# Patient Record
Sex: Male | Born: 1955 | Race: Black or African American | Hispanic: No | Marital: Single | Smoking: Never smoker
Health system: Southern US, Community
[De-identification: ages and names within clinical notes are randomized; demographics above are authoritative.]

## PROBLEM LIST (undated history)

## (undated) DIAGNOSIS — I639 Cerebral infarction, unspecified: Secondary | ICD-10-CM

## (undated) DIAGNOSIS — R569 Unspecified convulsions: Secondary | ICD-10-CM

## (undated) DIAGNOSIS — R609 Edema, unspecified: Secondary | ICD-10-CM

## (undated) DIAGNOSIS — I10 Essential (primary) hypertension: Secondary | ICD-10-CM

---

## 2004-07-03 ENCOUNTER — Ambulatory Visit: Payer: Self-pay | Admitting: Internal Medicine

## 2005-01-10 ENCOUNTER — Emergency Department: Payer: Self-pay | Admitting: Emergency Medicine

## 2006-01-18 ENCOUNTER — Ambulatory Visit: Payer: Self-pay | Admitting: Gastroenterology

## 2006-01-19 ENCOUNTER — Emergency Department: Payer: Self-pay | Admitting: Emergency Medicine

## 2006-07-20 ENCOUNTER — Emergency Department: Payer: Self-pay | Admitting: Emergency Medicine

## 2006-08-10 ENCOUNTER — Ambulatory Visit: Payer: Self-pay | Admitting: Gastroenterology

## 2007-01-14 ENCOUNTER — Emergency Department: Payer: Self-pay | Admitting: Emergency Medicine

## 2007-04-23 ENCOUNTER — Emergency Department: Payer: Self-pay | Admitting: Emergency Medicine

## 2008-05-11 ENCOUNTER — Emergency Department: Payer: Self-pay | Admitting: Internal Medicine

## 2008-08-22 ENCOUNTER — Emergency Department: Payer: Self-pay | Admitting: Emergency Medicine

## 2009-11-30 ENCOUNTER — Emergency Department: Payer: Self-pay | Admitting: Internal Medicine

## 2010-02-25 ENCOUNTER — Ambulatory Visit: Payer: Self-pay | Admitting: Emergency Medicine

## 2010-11-30 ENCOUNTER — Emergency Department: Payer: Self-pay | Admitting: Unknown Physician Specialty

## 2011-03-07 ENCOUNTER — Emergency Department: Payer: Self-pay | Admitting: Emergency Medicine

## 2011-11-22 ENCOUNTER — Emergency Department: Payer: Self-pay | Admitting: Unknown Physician Specialty

## 2011-11-22 LAB — CBC WITH DIFFERENTIAL/PLATELET
Basophil #: 0.1 10*3/uL (ref 0.0–0.1)
Basophil %: 1.1 %
Eosinophil #: 0 10*3/uL (ref 0.0–0.7)
HGB: 12.2 g/dL — ABNORMAL LOW (ref 13.0–18.0)
Lymphocyte %: 30.1 %
MCHC: 33.9 g/dL (ref 32.0–36.0)
MCV: 89 fL (ref 80–100)
Neutrophil #: 3.6 10*3/uL (ref 1.4–6.5)
Neutrophil %: 58.6 %
RBC: 4.03 10*6/uL — ABNORMAL LOW (ref 4.40–5.90)
RDW: 15 % — ABNORMAL HIGH (ref 11.5–14.5)
WBC: 6.2 10*3/uL (ref 3.8–10.6)

## 2011-11-22 LAB — COMPREHENSIVE METABOLIC PANEL
Anion Gap: 9 (ref 7–16)
BUN: 15 mg/dL (ref 7–18)
Bilirubin,Total: 0.2 mg/dL (ref 0.2–1.0)
Co2: 26 mmol/L (ref 21–32)
EGFR (African American): >60
Glucose: 108 mg/dL — ABNORMAL HIGH (ref 65–99)
Potassium: 3.6 mmol/L (ref 3.5–5.1)
SGPT (ALT): 21 U/L
Sodium: 135 mmol/L — ABNORMAL LOW (ref 136–145)
Total Protein: 8.4 g/dL — ABNORMAL HIGH (ref 6.4–8.2)

## 2011-11-22 LAB — SALICYLATE LEVEL: Salicylates, Serum: <1.7 mg/dL

## 2011-11-22 LAB — URIC ACID: Uric Acid: 6.5 mg/dL (ref 3.5–7.2)

## 2013-01-25 ENCOUNTER — Emergency Department: Payer: Self-pay

## 2013-01-25 LAB — COMPREHENSIVE METABOLIC PANEL
Albumin: 3.9 g/dL (ref 3.4–5.0)
Anion Gap: 4 — ABNORMAL LOW (ref 7–16)
BUN: 16 mg/dL (ref 7–18)
Co2: 31 mmol/L (ref 21–32)
EGFR (African American): >60
Glucose: 91 mg/dL (ref 65–99)
Osmolality: 275 (ref 275–301)
Potassium: 3.5 mmol/L (ref 3.5–5.1)
SGOT(AST): 19 U/L (ref 15–37)
Sodium: 137 mmol/L (ref 136–145)
Total Protein: 8.2 g/dL (ref 6.4–8.2)

## 2013-01-25 LAB — CBC
HCT: 37.3 % — ABNORMAL LOW (ref 40.0–52.0)
HGB: 13 g/dL (ref 13.0–18.0)
MCH: 30.5 pg (ref 26.0–34.0)
MCHC: 35 g/dL (ref 32.0–36.0)
MCV: 87 fL (ref 80–100)
Platelet: 198 10*3/uL (ref 150–440)
RDW: 15.3 % — ABNORMAL HIGH (ref 11.5–14.5)

## 2013-01-25 LAB — URINALYSIS, COMPLETE
Bacteria: NONE SEEN
Glucose,UR: NEGATIVE mg/dL (ref 0–75)
Leukocyte Esterase: NEGATIVE
Nitrite: NEGATIVE
Ph: 5 (ref 4.5–8.0)

## 2013-01-25 LAB — TROPONIN I: Troponin-I: <0.02 ng/mL

## 2014-01-06 ENCOUNTER — Emergency Department: Payer: Self-pay | Admitting: Emergency Medicine

## 2014-01-06 LAB — COMPREHENSIVE METABOLIC PANEL
ALBUMIN: 3.4 g/dL (ref 3.4–5.0)
ALT: 19 U/L (ref 12–78)
Alkaline Phosphatase: 101 U/L
Anion Gap: 8 (ref 7–16)
BUN: 12 mg/dL (ref 7–18)
Bilirubin,Total: 0.5 mg/dL (ref 0.2–1.0)
CO2: 28 mmol/L (ref 21–32)
CREATININE: 1.3 mg/dL (ref 0.60–1.30)
Calcium, Total: 10 mg/dL (ref 8.5–10.1)
Chloride: 102 mmol/L (ref 98–107)
EGFR (Non-African Amer.): 60 — ABNORMAL LOW
Glucose: 90 mg/dL (ref 65–99)
Osmolality: 275 (ref 275–301)
Potassium: 3.5 mmol/L (ref 3.5–5.1)
SGOT(AST): 17 U/L (ref 15–37)
Sodium: 138 mmol/L (ref 136–145)
Total Protein: 8.1 g/dL (ref 6.4–8.2)

## 2014-01-06 LAB — CBC WITH DIFFERENTIAL/PLATELET
BASOS ABS: 0 10*3/uL (ref 0.0–0.1)
Basophil %: 0.6 %
EOS ABS: 0 10*3/uL (ref 0.0–0.7)
EOS PCT: 0 %
HCT: 37.2 % — ABNORMAL LOW (ref 40.0–52.0)
HGB: 12.1 g/dL — ABNORMAL LOW (ref 13.0–18.0)
Lymphocyte #: 2.1 10*3/uL (ref 1.0–3.6)
Lymphocyte %: 28.7 %
MCH: 28.9 pg (ref 26.0–34.0)
MCHC: 32.5 g/dL (ref 32.0–36.0)
MCV: 89 fL (ref 80–100)
Monocyte #: 0.7 x10 3/mm (ref 0.2–1.0)
Monocyte %: 9.8 %
NEUTROS PCT: 60.9 %
Neutrophil #: 4.5 10*3/uL (ref 1.4–6.5)
Platelet: 200 10*3/uL (ref 150–440)
RBC: 4.19 10*6/uL — ABNORMAL LOW (ref 4.40–5.90)
RDW: 15.9 % — ABNORMAL HIGH (ref 11.5–14.5)
WBC: 7.4 10*3/uL (ref 3.8–10.6)

## 2014-01-06 LAB — URIC ACID: Uric Acid: 8.9 mg/dL — ABNORMAL HIGH (ref 3.5–7.2)

## 2014-01-06 LAB — SEDIMENTATION RATE: ERYTHROCYTE SED RATE: 38 mm/h — AB (ref 0–20)

## 2014-02-11 ENCOUNTER — Emergency Department: Payer: Self-pay | Admitting: Student

## 2014-06-22 DIAGNOSIS — I639 Cerebral infarction, unspecified: Secondary | ICD-10-CM

## 2014-06-22 HISTORY — DX: Cerebral infarction, unspecified: I63.9

## 2014-08-30 ENCOUNTER — Emergency Department: Payer: Self-pay | Admitting: Emergency Medicine

## 2014-09-09 ENCOUNTER — Emergency Department: Payer: Self-pay | Admitting: Physician Assistant

## 2014-10-27 ENCOUNTER — Emergency Department: Payer: Medicaid Other

## 2014-10-27 ENCOUNTER — Observation Stay
Admission: EM | Admit: 2014-10-27 | Discharge: 2014-10-28 | Disposition: A | Discharge status: Home / Self Care | Payer: Medicaid Other | Attending: Internal Medicine | Admitting: Internal Medicine

## 2014-10-27 ENCOUNTER — Encounter: Payer: Self-pay | Admitting: Emergency Medicine

## 2014-10-27 DIAGNOSIS — I10 Essential (primary) hypertension: Secondary | ICD-10-CM | POA: Diagnosis not present

## 2014-10-27 DIAGNOSIS — R4781 Slurred speech: Secondary | ICD-10-CM

## 2014-10-27 DIAGNOSIS — R569 Unspecified convulsions: Secondary | ICD-10-CM | POA: Diagnosis not present

## 2014-10-27 DIAGNOSIS — G459 Transient cerebral ischemic attack, unspecified: Secondary | ICD-10-CM | POA: Diagnosis not present

## 2014-10-27 DIAGNOSIS — Z7982 Long term (current) use of aspirin: Secondary | ICD-10-CM | POA: Insufficient documentation

## 2014-10-27 DIAGNOSIS — G458 Other transient cerebral ischemic attacks and related syndromes: Secondary | ICD-10-CM

## 2014-10-27 DIAGNOSIS — Z79899 Other long term (current) drug therapy: Secondary | ICD-10-CM | POA: Diagnosis not present

## 2014-10-27 DIAGNOSIS — Z888 Allergy status to other drugs, medicaments and biological substances status: Secondary | ICD-10-CM | POA: Insufficient documentation

## 2014-10-27 DIAGNOSIS — R531 Weakness: Secondary | ICD-10-CM

## 2014-10-27 HISTORY — DX: Essential (primary) hypertension: I10

## 2014-10-27 HISTORY — DX: Unspecified convulsions: R56.9

## 2014-10-27 LAB — LIPID PANEL
Cholesterol: 156 mg/dL (ref 0–200)
HDL: 38 mg/dL — ABNORMAL LOW (ref >40)
LDL CALC: 99 mg/dL (ref 0–99)
Total CHOL/HDL Ratio: 4.1 RATIO
Triglycerides: 93 mg/dL (ref <150)
VLDL: 19 mg/dL (ref 0–40)

## 2014-10-27 LAB — URINALYSIS COMPLETE WITH MICROSCOPIC (ARMC ONLY)
Bacteria, UA: NONE SEEN
Bilirubin Urine: NEGATIVE
Glucose, UA: NEGATIVE mg/dL
Ketones, ur: NEGATIVE mg/dL
Leukocytes, UA: NEGATIVE
NITRITE: NEGATIVE
PROTEIN: NEGATIVE mg/dL
SPECIFIC GRAVITY, URINE: 1.008 (ref 1.005–1.030)
Squamous Epithelial / LPF: NONE SEEN
pH: 6 (ref 5.0–8.0)

## 2014-10-27 LAB — CBC
HEMATOCRIT: 38.3 % — AB (ref 40.0–52.0)
HEMOGLOBIN: 12.8 g/dL — AB (ref 13.0–18.0)
MCH: 29.6 pg (ref 26.0–34.0)
MCHC: 33.3 g/dL (ref 32.0–36.0)
MCV: 88.8 fL (ref 80.0–100.0)
PLATELETS: 196 10*3/uL (ref 150–440)
RBC: 4.31 MIL/uL — AB (ref 4.40–5.90)
RDW: 15.3 % — ABNORMAL HIGH (ref 11.5–14.5)
WBC: 5.7 10*3/uL (ref 3.8–10.6)

## 2014-10-27 LAB — COMPREHENSIVE METABOLIC PANEL
ALBUMIN: 4.3 g/dL (ref 3.5–5.0)
ALK PHOS: 105 U/L (ref 38–126)
ALT: 19 U/L (ref 17–63)
ANION GAP: 6 (ref 5–15)
AST: 20 U/L (ref 15–41)
BILIRUBIN TOTAL: 0.4 mg/dL (ref 0.3–1.2)
BUN: 13 mg/dL (ref 6–20)
CHLORIDE: 100 mmol/L — AB (ref 101–111)
CO2: 27 mmol/L (ref 22–32)
Calcium: 10.2 mg/dL (ref 8.9–10.3)
Creatinine, Ser: 0.92 mg/dL (ref 0.61–1.24)
GFR calc Af Amer: >60 mL/min (ref >60)
GFR calc non Af Amer: >60 mL/min (ref >60)
Glucose, Bld: 90 mg/dL (ref 65–99)
POTASSIUM: 4 mmol/L (ref 3.5–5.1)
Sodium: 133 mmol/L — ABNORMAL LOW (ref 135–145)
Total Protein: 8.4 g/dL — ABNORMAL HIGH (ref 6.5–8.1)

## 2014-10-27 LAB — DIFFERENTIAL
BASOS ABS: 0.1 10*3/uL (ref 0–0.1)
BASOS PCT: 1 %
EOS PCT: 0 %
Eosinophils Absolute: 0 10*3/uL (ref 0–0.7)
Lymphocytes Relative: 52 %
Lymphs Abs: 3 10*3/uL (ref 1.0–3.6)
Monocytes Absolute: 0.4 10*3/uL (ref 0.2–1.0)
Monocytes Relative: 7 %
Neutro Abs: 2.3 10*3/uL (ref 1.4–6.5)
Neutrophils Relative %: 40 %

## 2014-10-27 LAB — PHENOBARBITAL LEVEL: Phenobarbital: 28.7 ug/mL (ref 15.0–40.0)

## 2014-10-27 LAB — GLUCOSE, CAPILLARY: Glucose-Capillary: 83 mg/dL (ref 70–99)

## 2014-10-27 LAB — TROPONIN I: Troponin I: <0.03 ng/mL (ref <0.031)

## 2014-10-27 MED ORDER — SENNOSIDES-DOCUSATE SODIUM 8.6-50 MG PO TABS: 1.0000 | ORAL_TABLET | Freq: Every evening | ORAL | Status: DC | PRN

## 2014-10-27 MED ORDER — NAPROXEN 500 MG PO TABS
500.0000 mg | ORAL_TABLET | Freq: Two times a day (BID) | ORAL | Status: DC
Start: 2014-10-28 — End: 2014-10-28
  Administered 2014-10-28: 500 mg via ORAL
  Filled 2014-10-27 (×3): qty 1

## 2014-10-27 MED ORDER — HYDRALAZINE HCL 20 MG/ML IJ SOLN
10.0000 mg | Freq: Four times a day (QID) | INTRAMUSCULAR | Status: DC | PRN
Start: 2014-10-27 — End: 2014-10-28

## 2014-10-27 MED ORDER — AMLODIPINE BESYLATE 10 MG PO TABS
10.0000 mg | ORAL_TABLET | Freq: Every day | ORAL | Status: DC
  Administered 2014-10-28: 10 mg via ORAL
  Filled 2014-10-27: qty 1

## 2014-10-27 MED ORDER — PHENOBARBITAL 32.4 MG PO TABS
145.8000 mg | ORAL_TABLET | Freq: Every day | ORAL | Status: DC
  Administered 2014-10-28: 145.8 mg via ORAL
  Filled 2014-10-27: qty 5

## 2014-10-27 MED ORDER — ASPIRIN 81 MG PO CHEW
CHEWABLE_TABLET | ORAL | Status: AC
  Filled 2014-10-27: qty 4

## 2014-10-27 MED ORDER — ENOXAPARIN SODIUM 40 MG/0.4ML ~~LOC~~ SOLN
40.0000 mg | Freq: Two times a day (BID) | SUBCUTANEOUS | Status: DC
  Administered 2014-10-27 – 2014-10-28 (×2): 40 mg via SUBCUTANEOUS
  Filled 2014-10-27 (×2): qty 0.4

## 2014-10-27 MED ORDER — ENOXAPARIN SODIUM 40 MG/0.4ML ~~LOC~~ SOLN: 40.0000 mg | SUBCUTANEOUS | Status: DC

## 2014-10-27 MED ORDER — PHENYTOIN SODIUM EXTENDED 100 MG PO CAPS
300.0000 mg | ORAL_CAPSULE | Freq: Two times a day (BID) | ORAL | Status: DC
  Administered 2014-10-27 – 2014-10-28 (×2): 300 mg via ORAL
  Filled 2014-10-27 (×2): qty 3

## 2014-10-27 MED ORDER — ACETAMINOPHEN 325 MG PO TABS: 650.0000 mg | ORAL_TABLET | Freq: Four times a day (QID) | ORAL | Status: DC | PRN

## 2014-10-27 MED ORDER — ASPIRIN EC 81 MG PO TBEC
81.0000 mg | DELAYED_RELEASE_TABLET | Freq: Every day | ORAL | Status: DC
  Administered 2014-10-28: 81 mg via ORAL
  Filled 2014-10-27: qty 1

## 2014-10-27 MED ORDER — DOXAZOSIN MESYLATE 4 MG PO TABS
4.0000 mg | ORAL_TABLET | Freq: Every day | ORAL | Status: DC
Start: 2014-10-28 — End: 2014-10-28
  Administered 2014-10-28: 4 mg via ORAL
  Filled 2014-10-27: qty 1

## 2014-10-27 MED ORDER — INDOMETHACIN 25 MG PO CAPS
25.0000 mg | ORAL_CAPSULE | Freq: Every day | ORAL | Status: DC
  Administered 2014-10-28: 25 mg via ORAL
  Filled 2014-10-27 (×2): qty 1

## 2014-10-27 MED ORDER — ACETAMINOPHEN 650 MG RE SUPP: 650.0000 mg | Freq: Four times a day (QID) | RECTAL | Status: DC | PRN

## 2014-10-27 MED ORDER — ASPIRIN 81 MG PO CHEW
324.0000 mg | CHEWABLE_TABLET | Freq: Once | ORAL | Status: AC
  Administered 2014-10-27: 324 mg via ORAL
  Administered 2014-10-27: 20:00:00 via ORAL

## 2014-10-27 NOTE — ED Notes (Signed)
Patient to ED with report of leg pain and weakness to right leg since yesterday, denies falling but reports he might have bumped into something.

## 2014-10-27 NOTE — ED Provider Notes (Signed)
[facility] Emergency Department Provider Note    ____________________________________________  Time seen: 1705 hrs.  I have reviewed the triage vital signs and the nursing notes.   HISTORY  Chief Complaint Leg Pain   History is given by the daughter secondary to patient is having slurred speech. Daughter stated the patient trying to get out of a car and lost his balance setback in the car and then try to get up again and could not stand up. When she questioned him his speech was different and she said more slurred-type speech. They decided to bring him to the emergency room. That also stated she noticed yesterday that he was weak especially with standing. She does state that his condition worsen [facility] [employer] [facility] [facility] today is the reason brought to the emergency room. After getting chief complaint patient will be transferred over to the acute care side of the ER for definitive evaluation and treatment. Discussed patient with Dr. Manson PasseyBrown immediately advises the patient transferred over to the acute care side.    HPI James Mcbride is a 59 y.o. male complainant is right sided weakness and slurred speech. Onset yesterday from information from the daughter. Weakness seems to be mostly on the right side. Patient is given a pain radiating 6/10. Patient gives no provocative incidentally says that his condition seems to begin worse.     Past Medical History  Diagnosis Date  . Seizures   . Hypertension     There are no active problems to display for this patient.   History reviewed. No pertinent past surgical history.  No current outpatient prescriptions on file.  Allergies Review of patient's allergies indicates no known allergies.  History reviewed. No pertinent family history.  Social History History  Substance Use Topics  . Smoking status: Never Smoker   . Smokeless tobacco: Never Used  . Alcohol Use: No    Review of  Systems  Constitutional: Negative for fever. Eyes: Negative for visual changes. ENT: Negative for sore throat. Cardiovascular: Negative for chest pain. Respiratory: Negative for shortness of breath. Gastrointestinal: Negative for abdominal pain, vomiting and diarrhea. Genitourinary: Negative for dysuria. Musculoskeletal: Negative for back pain. Skin: Negative for rash. Neurological: Negative for headache, positive focal weakness on the right side. Psychiatric:Negative Endocrine:Negative Hematological/Lymphatic:Negative Allergic/Immunilogical: Negative  10-point ROS otherwise negative.  ____________________________________________   PHYSICAL EXAM:  VITAL SIGNS: ED Triage Vitals  Enc Vitals Group     BP 10/27/14 1618 192/86 mmHg     Pulse Rate 10/27/14 1618 69     Resp 10/27/14 1618 20     Temp 10/27/14 1618 98.3 F (36.8 C)     Temp Source 10/27/14 1618 Oral     SpO2 10/27/14 1618 98 %     Weight 10/27/14 1618 311 lb (141.069 kg)     Height 10/27/14 1618 5\' 7"  (1.702 m)     Head Cir --      Peak Flow --      Pain Score 10/27/14 1619 6     Pain Loc --      Pain Edu? --      Excl. in GC? --      Constitutional: Alert and oriented. Speech is slightly slurred. Eye s: Conjunctivae are normal. PERRL. Normal extraocular movements. ENT   Head: Normocephalic and atraumatic.   Nose: No congestion/rhinnorhea.   Mouth/Throat: Mucous membranes are moist.   Neck: No stridor. Hematological/Lymphatic/Immunilogical: No cervical lymphadenopathy. Cardiovascular: Normal rate, regular rhythm. Normal and symmetric  distal pulses are present in all extremities. No murmurs, rubs, or gallops. Elevated BP 192/86. Respiratory: Normal respiratory effort without tachypnea nor retractions. Breath sounds are clear and equal bilaterally. No wheezes/rales/rhonchi. Gastrointestinal: Soft and nontender. No distention. No abdominal bruits. There is no CVA tenderness. GeniNot  examined Musculoskeletal: Nontender with normal range of motion in all extremities. No joint effusions.  No lower extremity tenderness nor edema. Neuro     No gross focal neurologic deficits are appreciated. Speech is slurred. Unsteady gait. Skin:  Skin is warm, dry and intact. No rash noted. Psychiatric: Mood and affect are normal. Speech and behavior are normal. Patient exhibits appropriate insight and judgment.  ____________________________________________    LABS (pertinent positives/negatives)  pending  ____________________________________________   EKG  pending  ____________________________________________    RADIOLOGY  pending  ____________________________________________   PROCEDURES  Procedure(s) performed: None  Critical Care performed: No  ____________________________________________   INITIAL IMPRESSION / ASSESSMENT AND PLAN / ED COURSE  Pertinent labs & imaging results that were available during my care of the patient were reviewed by me and considered in my medical decision making (see chart for details).  CVA  ____________________________________________   FINAL CLINICAL IMPRESSION(S) / ED DIAGNOSES  Final diagnoses:  None     Joni ReiningRonald K Smith, PA-C 10/27/14 1743  Governor Rooksebecca Lord, MD 10/27/14 2033

## 2014-10-27 NOTE — ED Notes (Signed)
Pt moved to ED16. Pt transported by EDT and RN.

## 2014-10-27 NOTE — H&P (Signed)
[facility] [place] [facility]   PATIENT NAME: James BakerHarold Macy    MR#:  [id number]  DATE OF BIRTH:  [birth year]  DATE OF ADMISSION:  10/27/2014  PRIMARY CARE PHYSICIAN: Arlyss QueenSELVIDGE,WILLIAM M, MD   REQUESTING/REFERRING PHYSICIAN: Dr. Shaune PollackLord  CHIEF COMPLAINT:   Chief Complaint  Patient presents with  . Leg Pain    HISTORY OF PRESENT ILLNESS:  James Mcbride  is a 59 y.o. male with a known history of hypertension and seizure disorder presents to the hospital secondary to right leg weakness and right-sided paresthesias. These sleepy and very poor historian. The history obtained from his sister at bedside who is also his caregiver. The patient's uncle who has been his caregiver for years recently passed away and [facility]. No prior history of stroke. Patient has history of seizure disorder since he was young secondary to a traumatic brain injury. No recent seizures.  [facility] [employer] [facility] and he was trying to get out of his car but couldn't stand steady because his right leg was giving way. He did not lose his consciousness he. He needed to hold onto the truck to steady his balance. Patient's sister immediately put him back in the car and drove him to the ER. She did notice that yesterday his speech has been garbled. This afternoon he also complained of right arm tingling and numbness but no weakness. No change in his vision. He did have some chills at home and increased urinary frequency and urgency. Patient is being admitted for possible TIA. CT of the head is negative for any acute changes.  PAST MEDICAL HISTORY:   Past Medical History  Diagnosis Date  . Seizures   . Hypertension    chronic lower extremity edema  PAST SURGICAL HISTORY:  History reviewed. No pertinent past surgical history.  SOCIAL HISTORY:   History  Substance Use Topics  . Smoking status: Never Smoker   . Smokeless  tobacco: Never Used  . Alcohol Use: No    FAMILY HISTORY:  History reviewed. No pertinent family history.  DRUG ALLERGIES:   Allergies  Allergen Reactions  . Dilantin [Phenytoin] Swelling  . Phenobarbital Other (See Comments)    Pt states it makes him real sleepy.    REVIEW OF SYSTEMS:   Review of Systems  Constitutional: Positive for chills. Negative for fever and weight loss.  HENT: Negative for ear discharge, ear pain, hearing loss and tinnitus.   Eyes: Negative for blurred vision, double vision and photophobia.  Respiratory: Negative for cough, hemoptysis, sputum production and shortness of breath.   Cardiovascular: Negative for chest pain, palpitations and orthopnea.  Gastrointestinal: Negative for heartburn, nausea, vomiting, abdominal pain, diarrhea and constipation.  Genitourinary: Positive for urgency and frequency. Negative for dysuria and hematuria.  Musculoskeletal: Negative for myalgias, back pain and neck pain.  Skin: Negative for rash.  Neurological: Positive for seizures. Negative for dizziness, tingling, tremors and headaches.  Endo/Heme/Allergies: Does not bruise/bleed easily.  Psychiatric/Behavioral: Negative for depression.    MEDICATIONS AT HOME:   Prior to Admission medications   Not on File    Need to be verified  VITAL SIGNS:  Blood pressure 192/86, pulse 69, temperature 98.3 F (36.8 C), temperature source Oral, resp. rate 20, height 5\' 7"  (1.702 m), weight 141.069 kg (311 lb), SpO2 98 %.  PHYSICAL EXAMINATION:   Physical Exam  GENERAL:  59 y.o.-year-old patient lying in the bed with no  acute distress.  EYES: Pupils equal, round, reactive to light and accommodation. No scleral icterus. Extraocular muscles intact.  HEENT: Head atraumatic, normocephalic. Oropharynx and nasopharynx clear.  NECK:  Supple, no jugular venous distention. No thyroid enlargement, no tenderness.  LUNGS: Normal breath sounds bilaterally, no wheezing, rales,rhonchi or  crepitation. No use of accessory muscles of respiration.  CARDIOVASCULAR: S1, S2 normal. No murmurs, rubs, or gallops.  ABDOMEN: Soft, nontender, nondistended. Bowel sounds present. No organomegaly or mass.  EXTREMITIES: Chronic lower extremity edema, No cyanosis, or clubbing.  NEUROLOGIC: Cranial nerves II through XII are intact. Muscle strength 5/5 in all extremities. Sensation intact. Gait not checked.  PSYCHIATRIC: The patient is alert and oriented x 3. Sleepy, arousable SKIN: No obvious rash, lesion, or ulcer.   LABORATORY PANEL:   CBC  Recent Labs Lab 10/27/14 1830  WBC 5.7  HGB 12.8*  HCT 38.3*  PLT 196   ------------------------------------------------------------------------------------------------------------------  Chemistries   Recent Labs Lab 10/27/14 1756  NA 133*  K 4.0  CL 100*  CO2 27  GLUCOSE 90  BUN 13  CREATININE 0.92  CALCIUM 10.2  AST 20  ALT 19  ALKPHOS 105  BILITOT 0.4   ------------------------------------------------------------------------------------------------------------------  Cardiac Enzymes  Recent Labs Lab 10/27/14 1756  TROPONINI <0.03   ------------------------------------------------------------------------------------------------------------------  RADIOLOGY:  Ct Head Wo Contrast  10/27/2014   CLINICAL DATA:  Slurred speech and loss of balance  EXAM: CT HEAD WITHOUT CONTRAST  TECHNIQUE: Contiguous axial images were obtained from the base of the skull through the vertex without intravenous contrast.  COMPARISON:  01/25/2013  FINDINGS: Bony calvarium is intact. No gross soft tissue abnormality is noted. Mild atrophic changes are seen. The lacunar infarct is noted within the right half of the pons. No findings to suggest acute hemorrhage, acute infarction or space-occupying mass lesion are noted.  IMPRESSION: Mild atrophic and ischemic changes.  No acute abnormality noted.   Electronically Signed   By: Alcide CleverMark  Lukens M.D.   On:  10/27/2014 17:56    EKG:   Orders placed or performed during the hospital encounter of 10/27/14  . ED EKG  . ED EKG    IMPRESSION AND PLAN:   James BakerHarold Wolanski  is a 59 y.o. male with a known history of hypertension and seizure disorder presents to the hospital secondary to right leg weakness and right-sided paresthesias.  #1 TIA-right-sided paresthesias and right leg weakness. Will admit to telemetry under observation. Order neuro checks. Physical therapy consult. MRI of the brain without contrast, carotid Dopplers, echocardiogram have been ordered. We'll start on aspirin at this time. Check lipid panel. His home medications need to be verified. #2 seizure disorder-no recent seizures. Once his home medications are verified will need to continue them. Dilantin level is ordered and is pending at this time. #3 hypertension-IV hydralazine when necessary. Verify home medications and restart them. #4 urinary symptoms-UA is negative. #5 chronic lymphedema-verify home dose of Lasix and restarted. #6 DVT prophylaxis-Lovenox.    All the records are reviewed and case discussed with ED provider. Management plans discussed with the patient, family and they are in agreement.  CODE STATUS: Full code  TOTAL TIME TAKING CARE OF THIS PATIENT: 50 minutes.    Enid BaasKALISETTI,Chenika Nevils M.D on 10/27/2014 at 8:07 PM  Between 7am to 6pm - Pager - [phone]  After 6pm go to [email or web address] - password [email or web address]  [facility]  Office  [phone]  CC: Primary care physician; Arlyss QueenSELVIDGE,WILLIAM M, MD

## 2014-10-27 NOTE — ED Provider Notes (Signed)
[facility]  [facility] was [facility] from [facility] care area to maintain ED ____________________________________________    LABS (pertinent positives/negatives)  CBC pending Urinalysis negative for urinary tract infection No significant electrolyte abnormalities Troponin negative  ____________________________________________    RADIOLOGY  Reviewed CT head results: Atrophic and ischemic changes  ____________________________________________   PROCEDURES  Procedure(s) performed: None  Critical Care performed: No  ____________________________________________   INITIAL IMPRESSION / ASSESSMENT AND PLAN / ED COURSE  Pertinent labs & imaging results that were available during my care of the patient were reviewed by me and considered in my medical decision making (see chart for details).  Patient was moved from Flex care area with a complaint concerning for stroke. I withheld additional history from the youngest sister who reports patient was last seen normal yesterday morning. When she returned to his house yesterday evening he was having slurred speech. All day today he's had slurred speech. This afternoon they noticed that he is having some trouble walking with his right leg.  Although CT head negative, I do suspect CVA. Hospitalist was consulted for admission. Aspirin was given. Family was updated    ____________________________________________   FINAL CLINICAL IMPRESSION(S) / ED DIAGNOSES  Acute right face arm and leg paresthesia Acute speech disturbance, slurred     Governor Rooksebecca Aitan Rossbach, MD 10/27/14 1925

## 2014-10-27 NOTE — ED Notes (Signed)
Blood sugar 83

## 2014-10-28 ENCOUNTER — Observation Stay: Payer: Medicaid Other

## 2014-10-28 ENCOUNTER — Observation Stay (HOSPITAL_BASED_OUTPATIENT_CLINIC_OR_DEPARTMENT_OTHER): Payer: Medicaid Other

## 2014-10-28 DIAGNOSIS — G459 Transient cerebral ischemic attack, unspecified: Secondary | ICD-10-CM

## 2014-10-28 LAB — CBC
HCT: 35.3 % — ABNORMAL LOW (ref 40.0–52.0)
HEMOGLOBIN: 11.8 g/dL — AB (ref 13.0–18.0)
MCH: 29.5 pg (ref 26.0–34.0)
MCHC: 33.3 g/dL (ref 32.0–36.0)
MCV: 88.5 fL (ref 80.0–100.0)
PLATELETS: 176 10*3/uL (ref 150–440)
RBC: 3.99 MIL/uL — ABNORMAL LOW (ref 4.40–5.90)
RDW: 15.5 % — ABNORMAL HIGH (ref 11.5–14.5)
WBC: 5 10*3/uL (ref 3.8–10.6)

## 2014-10-28 LAB — BASIC METABOLIC PANEL
Anion gap: 6 (ref 5–15)
BUN: 14 mg/dL (ref 6–20)
CO2: 28 mmol/L (ref 22–32)
CREATININE: 0.78 mg/dL (ref 0.61–1.24)
Calcium: 10.2 mg/dL (ref 8.9–10.3)
Chloride: 104 mmol/L (ref 101–111)
Glucose, Bld: 96 mg/dL (ref 65–99)
Potassium: 3.8 mmol/L (ref 3.5–5.1)
Sodium: 138 mmol/L (ref 135–145)

## 2014-10-28 MED ORDER — HYDRALAZINE HCL 25 MG PO TABS: 25.0000 mg | ORAL_TABLET | Freq: Three times a day (TID) | ORAL | Status: DC

## 2014-10-28 MED ORDER — SIMVASTATIN 20 MG PO TABS: 20.0000 mg | ORAL_TABLET | Freq: Every day | ORAL | Status: DC

## 2014-10-28 NOTE — Progress Notes (Signed)
Pt was admitted to 256 from ED with recent onset of right leg weakness and slurred speech. Pt A&o x4 on arrival Denies pain. Initial assessment and orientation completed with assist from pts sister. 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 Pts sister states current speech is his norm due to hx of epilectic seizures. Sister states he did have slurred speech earlier but she does not notice it at this time. Neuro checks are WNL except for slight weakness noted in right grip and right foot extension. VSS, afebrile, moderate fall. Tele NSR. Candi LeashJanice M Sheria Rosello

## 2014-10-28 NOTE — Evaluation (Signed)
Physical Therapy Evaluation Patient Details Name: James Mcbride MRN: [id number] DOB: Oct 29, 1955 Today's Date: 10/28/2014   History of Present Illness  R sided weakness  Clinical Impression  Pt shows good effort, but poor awareness with PT session.  He is able to do some walking, but is impulsive, has numerous small losses of balance needing assist to remain upright.  He also shows poor ability to coordinate R foot during ambulation (R foot trailing with each step), and poor general safety awareness with most acts.  Sister was very helpful and eager to assist with cuing, etc.  Pt does improve with VCs and ultimately does well enough that he could go home with a walker, 24 hour assist and HHPT.     Follow Up Recommendations Home health PT (24 hour assist)    Equipment Recommendations  Rolling walker with 5" wheels    Recommendations for Other Services       Precautions / Restrictions Precautions Precautions: Fall Restrictions Weight Bearing Restrictions: No      Mobility  Bed Mobility Overal bed mobility: Independent                Transfers Overall transfer level: Needs assistance Equipment used: Rolling walker (2 wheeled) Transfers: Sit to/from Stand Sit to Stand: Min assist            Ambulation/Gait Ambulation/Gait assistance: Mod assist;Min assist Ambulation Distance (Feet): 40 Feet Assistive device: Rolling walker (2 wheeled)          Stairs Stairs:  (instructed sister and pt on proper technique/sequencing)          Wheelchair Mobility    Modified Rankin (Stroke Patients Only)       Balance                                             Pertinent Vitals/Pain Pain Assessment:  (minimal R thigh discomfort)    Home Living Family/patient expects to be discharged to::  (family members home) Living Arrangements: Alone Producer, television/film/video([facility])                    Prior Function Level of Independence:  Independent               Hand Dominance        Extremity/Trunk Assessment   Upper Extremity Assessment: Overall WFL for tasks assessed           Lower Extremity Assessment: Overall WFL for tasks assessed (pt is not weaker on R, does have decreased quality of motion)         Communication   Communication:  (pt has had slurred speech for years)  Cognition   Behavior During Therapy: Impulsive Overall Cognitive Status: Within Functional Limits for tasks assessed                      General Comments      Exercises        Assessment/Plan    PT Assessment Patient needs continued PT services  PT Diagnosis Difficulty walking;Abnormality of gait;Altered mental status   PT Problem List Decreased strength;Decreased coordination;Decreased balance  PT Treatment Interventions     PT Goals (Current goals can be found in the Care Plan section) Acute Rehab PT Goals Patient Stated Goal: walk better PT Goal Formulation: With patient/family Potential to Achieve Goals:  Good    Frequency Min 2X/week   Barriers to discharge        Co-evaluation               End of Session Equipment Utilized During Treatment: Gait belt Activity Tolerance:  (pt impulsive, has difficulty following instruction) Patient left: with bed alarm set           Time: 1310-1335 PT Time Calculation (min) (ACUTE ONLY): 25 min   Charges:   PT Evaluation $Initial PT Evaluation Tier I: 1 Procedure     PT G Codes:       James Mcbride, PT, DPT [phone]#[id number]   James Mcbride 10/28/2014, 2:33 PM

## 2014-10-28 NOTE — Discharge Summary (Signed)
[facility] [place] at [facility]   PATIENT NAME: James BakerHarold Mcbride    MR#:  [id number]  DATE OF BIRTH:  [birth year]  DATE OF ADMISSION:  10/27/2014 ADMITTING PHYSICIAN: James Baasadhika Kalisetti, MD  DATE OF DISCHARGE: 10/28/2014  PRIMARY CARE PHYSICIAN: Arlyss QueenSELVIDGE,James M, MD    ADMISSION DIAGNOSIS:  Slurred speech [R47.81]  DISCHARGE DIAGNOSIS:  Principal Problem:   TIA (transient ischemic attack)   SECONDARY DIAGNOSIS:   Past Medical History  Diagnosis Date  . Seizures   . Hypertension     HOSPITAL COURSE:  This is a 59 year old male with a history of an seizures who presented with right lower extremity weakness. For further details please refer to the H&P.  1. TIA: Patient's CAT scan of the head on admission did not show evidence of a stroke. Patient's right-sided weakness has resolved. I suspect the patient may have had a TIA. His blood pressures also notably elevated which is likely contributing to his right-sided weakness. His symptoms have resolved. Patient will need to take an aspirin daily and statin. Patient does not take an aspirin on a daily basis. Carotid Dopplers did not show evidence of hemodynamically significant stenosis.MRI did not show a CVA.  2. Accelerated hypertension: Patient will need close follow-up with his outpatient primary care physician. I started hydralazine in the interim. Patient will continue on his outpatient medications.  3. History of seizure disorder: Patient will continue his outpatient medications.     DISCHARGE CONDITIONS AND DIET:  Heart healthy diet. Stable.  CONSULTS OBTAINED:     DRUG ALLERGIES:   Allergies  Allergen Reactions  . Vasotec [Enalaprilat]     Swelling     DISCHARGE MEDICATIONS:   Current Discharge Medication List    START taking these medications   Details  hydrALAZINE (APRESOLINE) 25 MG tablet Take 1 tablet (25 mg total) by mouth 3 (three) times daily. Qty: 90 tablet, Refills: 0    simvastatin (ZOCOR) 20 MG tablet Take 1 tablet (20 mg total) by mouth daily. Qty: 30 tablet, Refills: 0      CONTINUE these medications which have NOT CHANGED   Details  amLODipine (NORVASC) 10 MG tablet Take 10 mg by mouth daily.    aspirin 81 MG tablet Take 81 mg by mouth daily.    doxazosin (CARDURA) 4 MG tablet Take 4 mg by mouth daily.    indomethacin (INDOCIN) 25 MG capsule Take 25 mg by mouth daily. Take one every day per patient    naproxen (NAPROSYN) 500 MG tablet Take 500 mg by mouth 2 (two) times daily with a meal.    PHENobarbital (LUMINAL) 97.2 MG tablet Take 48.6-97.2 mg by mouth See admin instructions. Take 1&1/2 tablets by mouth every day to prevent seizures    phenytoin (DILANTIN) 100 MG ER capsule Take 300 mg by mouth 2 (two) times daily.              Today   CHIEF COMPLAINT:  Patient is feeling fine this morning. No neurological deficits are noted. His right-sided weakness has improved. VITAL SIGNS:  Blood pressure 179/67, pulse 61, temperature 97.5 F (36.4 C), temperature source Oral, resp. rate 17, height 5\' 7"  (1.702 m), weight 142.157 kg (313 lb 6.4 oz), SpO2 99 %.   REVIEW OF SYSTEMS:  Review of Systems  Constitutional: Negative for fever, chills and weight loss.  Eyes: Negative for blurred vision and double vision.  Respiratory: Negative for cough and shortness of breath.   Cardiovascular: Negative for  chest pain and palpitations.  Gastrointestinal: Negative for heartburn, nausea, vomiting and abdominal pain.  Genitourinary: Negative for dysuria.  Neurological: Negative for dizziness, tingling, tremors, sensory change, speech change, focal weakness and headaches.  All other systems reviewed and are negative.    PHYSICAL EXAMINATION:  GENERAL:  59 y.o.-year-old patient lying in the bed with no acute distress.  NECK:  Supple, no jugular venous distention. No thyroid enlargement, no tenderness.  LUNGS: Normal breath sounds bilaterally,  no wheezing, rales,rhonchi  No use of accessory muscles of respiration.  CARDIOVASCULAR: S1, S2 normal. No murmurs, rubs, or gallops.  ABDOMEN: Soft, non-tender, non-distended. Bowel sounds present. No organomegaly or mass.  EXTREMITIES: No pedal edema, cyanosis, or clubbing.  PSYCHIATRIC: The patient is alert and oriented x 3.  SKIN: No obvious rash, lesion, or ulcer.  Neurological: Cranial nerves II through XII are intact. No focal deficits. No sensory deficits. Patient has 4/5 strength bilateral and symmetrical. No facial droop noted. DATA REVIEW:   CBC  Recent Labs Lab 10/28/14 0518  WBC 5.0  HGB 11.8*  HCT 35.3*  PLT 176    Chemistries   Recent Labs Lab 10/27/14 1756 10/28/14 0518  NA 133* 138  K 4.0 3.8  CL 100* 104  CO2 27 28  GLUCOSE 90 96  BUN 13 14  CREATININE 0.92 0.78  CALCIUM 10.2 10.2  AST 20  --   ALT 19  --   ALKPHOS 105  --   BILITOT 0.4  --     Cardiac Enzymes  Recent Labs Lab 10/27/14 1756  TROPONINI <0.03    Microbiology Results  No results found for this or any previous visit.  RADIOLOGY:  Ct Head Wo Contrast  10/27/2014   IMPRESSION: Mild atrophic and ischemic changes.  No acute abnormality noted.   Electronically Signed   By: Alcide CleverMark  Lukens M.D.   On: 10/27/2014 17:56   [place] Carotid Bilateral  10/28/2014  IMPRESSION: 1. Mild left carotid bifurcation plaque resulting in less than 50% diameter stenosis. The exam does not exclude plaque ulceration or embolization. Continued surveillance recommended.   Electronically Signed   By: Corlis Leak  Hassell M.D.   On: 10/28/2014 11:11    MRI BRAIN: no acute CVA    Management plans discussed with the patient and he is in agreement. Stable for discharge home  Patient will need to follow up with primary care physician in one week.  CODE STATUS:     Code Status Orders        Start     Ordered   10/27/14 2203  Full code   Continuous     10/27/14 2202      TOTAL TIME TAKING CARE OF THIS PATIENT:  35 minutes.    Bunny Lowdermilk M.D on 10/28/2014 at 11:40 AM  Between 7am to 6pm - Pager - [phone] After 6pm go to [email or web address] - password [email or web address] [facility]  [facility]  Office  [phone]  CC: Primary care physician; Arlyss QueenSELVIDGE,James M, MD

## 2014-10-28 NOTE — Progress Notes (Signed)
Discussed home health with Mr James Mcbride daughter James Mcbride with whom he will be residing for several weeks after discharge today. Rene KocherRegina Mcbride's address is [address], [name], [place], [address], phone : [phone].  Ms James Mcbride chose [facility] as her father's home health provider. A referral for PT, RN, Aid was faxed and called to [facility]. Mr James Mcbride is currently awaiting the arrival of his rolling walker from [facility] the script for which this writer received at 3:45pm today.

## 2014-10-28 NOTE — Progress Notes (Signed)
Discharge instructions along with home medication list and follow up gone over with patient and family. Family member stated she understood instructions. Printed rx given with discharge paperwork. Iv and telemetry were removed. Patient was waiting on walker however per [facility] the time frame for arrival is between 6-10. Family member not satisfied with arrival time and wait time already. Spoke with [facility] and was told patient can pick up walker at [address] tomorrow morning. Made the patient and family aware that first thing the the morning call the store which the number was provided and arrange to pick up walker. Patient to be discharge to family members home with home health on ra. No s/s of distress noted. Patient to be taken off the floor via wheelchair.  Ziza Hastings [employer] Montelongo

## 2014-10-28 NOTE — Progress Notes (Signed)
Notified dr. Juliene PinaMody of mri results per md patient okay to be discharged home with home health.  Terez Montee [employer] Montelongo

## 2014-11-23 ENCOUNTER — Encounter: Payer: Self-pay | Admitting: *Deleted

## 2016-07-18 ENCOUNTER — Emergency Department: Payer: Medicaid Other

## 2016-07-18 ENCOUNTER — Emergency Department
Admission: EM | Admit: 2016-07-18 | Discharge: 2016-07-18 | Disposition: A | Discharge status: Home / Self Care | Payer: Medicaid Other | Attending: Emergency Medicine | Admitting: Emergency Medicine

## 2016-07-18 ENCOUNTER — Encounter: Payer: Self-pay | Admitting: *Deleted

## 2016-07-18 DIAGNOSIS — R0602 Shortness of breath: Secondary | ICD-10-CM | POA: Insufficient documentation

## 2016-07-18 DIAGNOSIS — R0981 Nasal congestion: Secondary | ICD-10-CM | POA: Diagnosis present

## 2016-07-18 DIAGNOSIS — Z7982 Long term (current) use of aspirin: Secondary | ICD-10-CM | POA: Insufficient documentation

## 2016-07-18 DIAGNOSIS — J069 Acute upper respiratory infection, unspecified: Secondary | ICD-10-CM

## 2016-07-18 DIAGNOSIS — Z79899 Other long term (current) drug therapy: Secondary | ICD-10-CM | POA: Diagnosis not present

## 2016-07-18 DIAGNOSIS — R601 Generalized edema: Secondary | ICD-10-CM | POA: Insufficient documentation

## 2016-07-18 DIAGNOSIS — I10 Essential (primary) hypertension: Secondary | ICD-10-CM | POA: Insufficient documentation

## 2016-07-18 HISTORY — DX: Cerebral infarction, unspecified: I63.9

## 2016-07-18 LAB — URINALYSIS, COMPLETE (UACMP) WITH MICROSCOPIC
BILIRUBIN URINE: NEGATIVE
GLUCOSE, UA: NEGATIVE mg/dL
HGB URINE DIPSTICK: NEGATIVE
KETONES UR: NEGATIVE mg/dL
LEUKOCYTES UA: NEGATIVE
NITRITE: NEGATIVE
PH: 6 (ref 5.0–8.0)
Protein, ur: 30 mg/dL — AB
SQUAMOUS EPITHELIAL / LPF: NONE SEEN
Specific Gravity, Urine: 1.011 (ref 1.005–1.030)

## 2016-07-18 LAB — CBC
HEMATOCRIT: 35.8 % — AB (ref 40.0–52.0)
Hemoglobin: 11.6 g/dL — ABNORMAL LOW (ref 13.0–18.0)
MCH: 27.7 pg (ref 26.0–34.0)
MCHC: 32.5 g/dL (ref 32.0–36.0)
MCV: 85.2 fL (ref 80.0–100.0)
Platelets: 159 10*3/uL (ref 150–440)
RBC: 4.2 MIL/uL — ABNORMAL LOW (ref 4.40–5.90)
RDW: 15.2 % — AB (ref 11.5–14.5)
WBC: 8.6 10*3/uL (ref 3.8–10.6)

## 2016-07-18 LAB — HEPATIC FUNCTION PANEL
ALT: 17 U/L (ref 17–63)
AST: 14 U/L — ABNORMAL LOW (ref 15–41)
Albumin: 3.8 g/dL (ref 3.5–5.0)
Alkaline Phosphatase: 91 U/L (ref 38–126)
BILIRUBIN INDIRECT: 0.3 mg/dL (ref 0.3–0.9)
Bilirubin, Direct: 0.1 mg/dL (ref 0.1–0.5)
TOTAL PROTEIN: 7.7 g/dL (ref 6.5–8.1)
Total Bilirubin: 0.4 mg/dL (ref 0.3–1.2)

## 2016-07-18 LAB — BASIC METABOLIC PANEL
ANION GAP: 6 (ref 5–15)
BUN: 14 mg/dL (ref 6–20)
CALCIUM: 10.5 mg/dL — AB (ref 8.9–10.3)
CO2: 26 mmol/L (ref 22–32)
Chloride: 108 mmol/L (ref 101–111)
Creatinine, Ser: 0.97 mg/dL (ref 0.61–1.24)
GFR calc Af Amer: >60 mL/min (ref >60)
Glucose, Bld: 104 mg/dL — ABNORMAL HIGH (ref 65–99)
POTASSIUM: 4.3 mmol/L (ref 3.5–5.1)
SODIUM: 140 mmol/L (ref 135–145)

## 2016-07-18 LAB — TROPONIN I

## 2016-07-18 LAB — BRAIN NATRIURETIC PEPTIDE: B NATRIURETIC PEPTIDE 5: 64 pg/mL (ref 0.0–100.0)

## 2016-07-18 MED ORDER — FUROSEMIDE 20 MG PO TABS: 20.0000 mg | ORAL_TABLET | Freq: Every day | ORAL | 11 refills | Status: DC

## 2016-07-18 MED ORDER — FUROSEMIDE 40 MG PO TABS: 40.0000 mg | ORAL_TABLET | Freq: Every day | ORAL | 11 refills | Status: DC

## 2016-07-18 MED ORDER — FUROSEMIDE 40 MG PO TABS
40.0000 mg | ORAL_TABLET | Freq: Once | ORAL | Status: AC
  Administered 2016-07-18: 40 mg via ORAL
  Filled 2016-07-18: qty 1

## 2016-07-18 MED ORDER — OXYMETAZOLINE HCL 0.05 % NA SOLN
1.0000 | Freq: Once | NASAL | Status: AC
  Administered 2016-07-18: 1 via NASAL
  Filled 2016-07-18: qty 15

## 2016-07-18 MED ORDER — OXYMETAZOLINE HCL 0.05 % NA SOLN: 2.0000 | Freq: Two times a day (BID) | NASAL | 2 refills | Status: AC

## 2016-07-18 NOTE — ED Provider Notes (Signed)
[facility] Emergency Department Provider Note        Time seen: ----------------------------------------- 3:35 PM on 07/18/2016 -----------------------------------------    I have reviewed the triage vital signs and the nursing notes.   HISTORY  Chief Complaint Leg Swelling    HPI James Mcbride is a 61 y.o. male who presents to ER for swelling in both legs and nasal congestion. [facility]. Family reports congestion is her main concern. He presents with bilateral leg swelling and no known history of CHF. Family thinks he may be taking a diuretic but they're not sure what the name of it is. They report a 70 pound weight gain over the last year. Patient reports he still making urine like normal.   Past Medical History:  Diagnosis Date  . Hypertension   . Seizures (HCC)   . Stroke [facility](HCC) 2016    Patient Active Problem List   Diagnosis Date Noted  . TIA (transient ischemic attack) 10/27/2014    History reviewed. No pertinent surgical history.  Allergies Vasotec [enalaprilat]  Social History Social History  Substance Use Topics  . Smoking status: Never Smoker  . Smokeless tobacco: Never Used  . Alcohol use No    Review of Systems Constitutional: Negative for fever. ENT: Positive for nasal passage congestion Cardiovascular: Negative for chest pain. Respiratory: Positive for shortness of breath Gastrointestinal: Negative for abdominal pain, vomiting and diarrhea. Genitourinary: Negative for dysuria. Musculoskeletal: Positive for edema Skin: Negative for rash. Neurological: Negative for headaches, focal weakness or numbness.  10-point ROS otherwise negative.  ____________________________________________   PHYSICAL EXAM:  VITAL SIGNS: ED Triage Vitals  Enc Vitals Group     BP 07/18/16 1355 (!) 162/52     Pulse Rate 07/18/16 1355 68     Resp 07/18/16 1355 (!) 22     Temp 07/18/16 1355 97.6 F  (36.4 C)     Temp Source 07/18/16 1355 Oral     SpO2 07/18/16 1355 96 %     Weight 07/18/16 1358 (!) 352 lb (159.7 kg)     Height 07/18/16 1358 5\' 8"  (1.727 m)     Head Circumference --      Peak Flow --      Pain Score --      Pain Loc --      Pain Edu? --      Excl. in GC? --     Constitutional: Alert and oriented. No obvious distress, patient appears diffusely edematous resembling anasarca Eyes: Conjunctivae are normal. PERRL. Normal extraocular movements. ENT   Head: Normocephalic and atraumatic.   Nose: Bilateral nasal passage congestion and edema   Mouth/Throat: Mucous membranes are moist.   Neck: No stridor. Cardiovascular: Normal rate, regular rhythm. No murmurs, rubs, or gallops. Respiratory: Normal respiratory effort without tachypnea nor retractions basilar rales Gastrointestinal: Soft and nontender. Normal bowel sounds. Edema progresses to the abdominal wall Musculoskeletal: Nontender with normal range of motion in all extremities. Massive lower extremity edema is noted Neurologic:  Normal speech and language. No gross focal neurologic deficits are appreciated.  Skin:  Skin is warm, dry and intact. No rash noted. Psychiatric: Mood and affect are normal. Speech and behavior are normal.  ____________________________________________  EKG: Interpreted by me. Sinus rhythm rate of 24 bpm, normal PR interval, normal QRS, normal QT, normal axis.  ____________________________________________  ED COURSE:  Pertinent labs & imaging results that were available during my care of the patient were reviewed by me  and considered in my medical decision making (see chart for details). Patient presents to the ER with edema. I will check basic labs and imaging. Suspect underlying renal failure   Procedures ____________________________________________   LABS (pertinent positives/negatives)  Labs Reviewed  BASIC METABOLIC PANEL - Abnormal; Notable for the following:        Result Value   Glucose, Bld 104 (*)    Calcium 10.5 (*)    All other components within normal limits  CBC - Abnormal; Notable for the following:    RBC 4.20 (*)    Hemoglobin 11.6 (*)    HCT 35.8 (*)    RDW 15.2 (*)    All other components within normal limits  HEPATIC FUNCTION PANEL - Abnormal; Notable for the following:    AST 14 (*)    All other components within normal limits  TROPONIN I  BRAIN NATRIURETIC PEPTIDE  URINALYSIS, COMPLETE (UACMP) WITH MICROSCOPIC    RADIOLOGY Images were viewed by me  Chest x-ray IMPRESSION: Cardiomegaly. No active disease. ____________________________________________  FINAL ASSESSMENT AND PLAN  Edema  Plan: Patient with labs and imaging as dictated above. Patient's findings are likely indicative of diastolic heart failure. His progression has been gradual. We have started him on Lasix here and he will be discharged on same. He'll be referred to cardiology for outpatient follow-up.   Emily Filbert, MD   Note: This note was generated in part or whole with voice recognition software. Voice recognition is usually quite accurate but there are transcription errors that can and very often do occur. I apologize for any typographical errors that were not detected and corrected.     Emily Filbert, MD 07/18/16 [id number]

## 2016-07-18 NOTE — ED Triage Notes (Signed)
Pt arrived [facility] ED from the [facility] reporting swelling in both legs and nasal congestion. Family reports pts congestion is their main concern but pt presents with bilateral leg swelling and no known hx of CHF. +2 edema noted to both legs. SOB noted but family reports this to be due to congestion.   Family also verbalized support hose did not fit properly for leg swelling. PCP not back in office until Monday.

## 2017-10-12 ENCOUNTER — Other Ambulatory Visit: Payer: Self-pay

## 2017-10-12 ENCOUNTER — Emergency Department
Admission: EM | Admit: 2017-10-12 | Discharge: 2017-10-12 | Disposition: A | Discharge status: Home / Self Care | Payer: Medicaid Other | Attending: Student in an Organized Health Care Education/Training Program | Admitting: Student in an Organized Health Care Education/Training Program

## 2017-10-12 ENCOUNTER — Encounter: Payer: Self-pay | Admitting: Emergency Medicine

## 2017-10-12 ENCOUNTER — Emergency Department: Payer: Medicaid Other

## 2017-10-12 DIAGNOSIS — R2243 Localized swelling, mass and lump, lower limb, bilateral: Secondary | ICD-10-CM | POA: Diagnosis present

## 2017-10-12 DIAGNOSIS — R600 Localized edema: Secondary | ICD-10-CM | POA: Diagnosis not present

## 2017-10-12 DIAGNOSIS — I509 Heart failure, unspecified: Secondary | ICD-10-CM | POA: Insufficient documentation

## 2017-10-12 DIAGNOSIS — Z7982 Long term (current) use of aspirin: Secondary | ICD-10-CM | POA: Diagnosis not present

## 2017-10-12 DIAGNOSIS — R0600 Dyspnea, unspecified: Secondary | ICD-10-CM | POA: Diagnosis not present

## 2017-10-12 DIAGNOSIS — R0601 Orthopnea: Secondary | ICD-10-CM | POA: Insufficient documentation

## 2017-10-12 DIAGNOSIS — Z79899 Other long term (current) drug therapy: Secondary | ICD-10-CM | POA: Diagnosis not present

## 2017-10-12 DIAGNOSIS — R609 Edema, unspecified: Secondary | ICD-10-CM

## 2017-10-12 DIAGNOSIS — I110 Hypertensive heart disease with heart failure: Secondary | ICD-10-CM | POA: Diagnosis not present

## 2017-10-12 HISTORY — DX: Edema, unspecified: R60.9

## 2017-10-12 LAB — BASIC METABOLIC PANEL
Anion gap: 4 — ABNORMAL LOW (ref 5–15)
BUN: 14 mg/dL (ref 6–20)
CALCIUM: 10.3 mg/dL (ref 8.9–10.3)
CO2: 27 mmol/L (ref 22–32)
CREATININE: 0.81 mg/dL (ref 0.61–1.24)
Chloride: 104 mmol/L (ref 101–111)
GFR calc Af Amer: >60 mL/min (ref >60)
GFR calc non Af Amer: >60 mL/min (ref >60)
Glucose, Bld: 91 mg/dL (ref 65–99)
Potassium: 4 mmol/L (ref 3.5–5.1)
Sodium: 135 mmol/L (ref 135–145)

## 2017-10-12 LAB — CBC
HCT: 34.9 % — ABNORMAL LOW (ref 40.0–52.0)
Hemoglobin: 11.5 g/dL — ABNORMAL LOW (ref 13.0–18.0)
MCH: 27.5 pg (ref 26.0–34.0)
MCHC: 32.9 g/dL (ref 32.0–36.0)
MCV: 83.7 fL (ref 80.0–100.0)
Platelets: 152 10*3/uL (ref 150–440)
RBC: 4.17 MIL/uL — ABNORMAL LOW (ref 4.40–5.90)
RDW: 16.2 % — ABNORMAL HIGH (ref 11.5–14.5)
WBC: 4.9 10*3/uL (ref 3.8–10.6)

## 2017-10-12 LAB — TROPONIN I

## 2017-10-12 MED ORDER — FUROSEMIDE 10 MG/ML IJ SOLN
60.0000 mg | Freq: Once | INTRAMUSCULAR | Status: AC
  Administered 2017-10-12: 60 mg via INTRAVENOUS
  Filled 2017-10-12: qty 6

## 2017-10-12 MED ORDER — OXYMETAZOLINE HCL 0.05 % NA SOLN
1.0000 | Freq: Once | NASAL | Status: AC
  Administered 2017-10-12: 1 via NASAL
  Filled 2017-10-12 (×2): qty 15

## 2017-10-12 MED ORDER — FUROSEMIDE 10 MG/ML IJ SOLN
INTRAMUSCULAR | Status: AC
  Administered 2017-10-12: 60 mg via INTRAVENOUS
  Filled 2017-10-12: qty 10

## 2017-10-12 NOTE — ED Triage Notes (Signed)
Here for swelling in legs since christmas per pt but worse over last few weeks.  Has had some SHOB as well, +orthopnea.  Tachypnea noted but no increase WOB at this time.  Pain only to legs. No hx of CHF but does take 2 fluid pills for swelling.

## 2017-10-12 NOTE — ED Notes (Signed)
Pts family to the desk reporting pt is feeling short of breath. VS assessed.

## 2017-10-12 NOTE — ED Provider Notes (Signed)
[facility] Emergency Department Provider Note    First MD Initiated Contact with Patient 10/12/17 1622     (approximate)  I have reviewed the triage vital signs and the nursing notes.   HISTORY  Chief Complaint Leg Swelling and Shortness of Breath    HPI James Mcbride is a 62 y.o. male a history of long-standing hypertension but no formal diagnosis of congestive heart failure presents with worsening orthopnea, worsening lower extremity swelling bilaterally some discomfort secondary to that.  States that he has not made any recent changes to his medications or diet.  States he does feel that he is more swollen than previous.  States that the Lasix pills do make him pee.  He denies any chest pain.  States he has not recently seen a cardiologist.  Past Medical History:  Diagnosis Date  . Hypertension   . Seizures (HCC)   . Stroke (HCC) 2016  . Swelling    Family History  Problem Relation Age of Onset  . Kidney disease Mother   . Diabetes Mother   . Cancer Other    History reviewed. No pertinent surgical history. Patient Active Problem List   Diagnosis Date Noted  . TIA (transient ischemic attack) 10/27/2014      Prior to Admission medications   Medication Sig Start Date End Date Taking? Authorizing Provider  amLODipine (NORVASC) 10 MG tablet Take 10 mg by mouth daily.    [provider]  aspirin 81 MG tablet Take 81 mg by mouth daily.    [provider]  atorvastatin (LIPITOR) 10 MG tablet Take 10 mg by mouth daily.    [provider]  doxazosin (CARDURA) 8 MG tablet Take 4 mg by mouth at bedtime.     [provider]  fluticasone (FLONASE) 50 MCG/ACT nasal spray Place 2 sprays into both nostrils daily.    [provider]  furosemide (LASIX) 20 MG tablet Take 1 tablet (20 mg total) by mouth daily. 07/18/16 07/18/17  Emily FilbertWilliams, Jonathan E, MD  furosemide (LASIX) 40 MG tablet Take 1 tablet (40 mg total)  by mouth daily. 07/18/16 07/18/17  Emily FilbertWilliams, Jonathan E, MD  hydrALAZINE (APRESOLINE) 25 MG tablet Take 1 tablet (25 mg total) by mouth 3 (three) times daily. Patient taking differently: Take 100 mg by mouth 3 (three) times daily.  10/28/14   Adrian SaranMody, Sital, MD  levETIRAcetam (KEPPRA) 750 MG tablet Take 750 mg by mouth 2 (two) times daily.    [provider]  PHENobarbital (LUMINAL) 97.2 MG tablet Take 97.2 mg by mouth at bedtime.     [provider]  phenytoin (DILANTIN) 100 MG ER capsule Take 100 mg by mouth 2 (two) times daily.     [provider]  senna (SENOKOT) 8.6 MG TABS tablet Take 1 tablet by mouth.    [provider]  simvastatin (ZOCOR) 20 MG tablet Take 1 tablet (20 mg total) by mouth daily. Patient not taking: Reported on 07/18/2016 10/28/14   Adrian SaranMody, Sital, MD    Allergies Vasotec [enalaprilat]    Social History Social History   Tobacco Use  . Smoking status: Never Smoker  . Smokeless tobacco: Never Used  Substance Use Topics  . Alcohol use: No  . Drug use: No    Review of Systems Patient denies headaches, rhinorrhea, blurry vision, numbness, shortness of breath, chest pain, edema, cough, abdominal pain, nausea, vomiting, diarrhea, dysuria, fevers, rashes or hallucinations unless otherwise stated above in HPI. ____________________________________________  PHYSICAL EXAM:  VITAL SIGNS: Vitals:   10/12/17 1245 10/12/17 1515  BP: (!) 155/67 (!) 168/66  Pulse: (!) 50 (!) 55  Resp: (!) 26 (!) 21  Temp: 97.6 F (36.4 C)   SpO2: 96% 98%    Constitutional: Alert and oriented.  in no acute distress. Eyes: Conjunctivae are normal.  Head: Atraumatic. Nose: L>R congestion/rhinnorhea. Mouth/Throat: Mucous membranes are moist.   Neck: No stridor. Painless ROM.  Cardiovascular: Normal rate, regular rhythm. Grossly normal heart sounds.  Good peripheral circulation. Respiratory: Normal respiratory effort.  No retractions. Lungs with bibasilar  crackles Gastrointestinal: Soft and nontender. No distention. No abdominal bruits. No CVA tenderness. Genitourinary:  Musculoskeletal: No lower extremity tenderness 3+ BLE pitting edema.  No joint effusions. Neurologic:  Normal speech and language. No gross focal neurologic deficits are appreciated. No facial droop Skin:  Skin is warm, dry and intact. Chronic venous stasis changes to BLE Psychiatric: Mood and affect are normal.  ____________________________________________   LABS (all labs ordered are listed, but only abnormal results are displayed)  Results for orders placed or performed during the hospital encounter of 10/12/17 (from the past 24 hour(s))  Basic metabolic panel     Status: Abnormal   Collection Time: 10/12/17 12:57 PM  Result Value Ref Range   Sodium 135 135 - 145 mmol/L   Potassium 4.0 3.5 - 5.1 mmol/L   Chloride 104 101 - 111 mmol/L   CO2 27 22 - 32 mmol/L   Glucose, Bld 91 65 - 99 mg/dL   BUN 14 6 - 20 mg/dL   Creatinine, Ser 4.09 0.61 - 1.24 mg/dL   Calcium 81.1 8.9 - 91.4 mg/dL   GFR calc non Af Amer >60 >60 mL/min   GFR calc Af Amer >60 >60 mL/min   Anion gap 4 (L) 5 - 15  CBC     Status: Abnormal   Collection Time: 10/12/17 12:57 PM  Result Value Ref Range   WBC 4.9 3.8 - 10.6 K/uL   RBC 4.17 (L) 4.40 - 5.90 MIL/uL   Hemoglobin 11.5 (L) 13.0 - 18.0 g/dL   HCT 78.2 (L) 95.6 - 21.3 %   MCV 83.7 80.0 - 100.0 fL   MCH 27.5 26.0 - 34.0 pg   MCHC 32.9 32.0 - 36.0 g/dL   RDW 08.6 (H) 57.8 - 46.9 %   Platelets 152 150 - 440 K/uL  Troponin I     Status: None   Collection Time: 10/12/17 12:57 PM  Result Value Ref Range   Troponin I <0.03 <0.03 ng/mL   ____________________________________________  EKG My review and personal interpretation at Time: 12:51   Indication: sob  Rate: 50  Rhythm: sinus Axis: normal Other: normal intervals, no ste, nonspecific st abn ____________________________________________  RADIOLOGY  I personally reviewed all  radiographic images ordered to evaluate for the above acute complaints and reviewed radiology reports and findings.  These findings were personally discussed with the patient.  Please see medical record for radiology report.  ____________________________________________   PROCEDURES  Procedure(s) performed:  Procedures    Critical Care performed: no ____________________________________________   INITIAL IMPRESSION / ASSESSMENT AND PLAN / ED COURSE  Pertinent labs & imaging results that were available during my care of the patient were reviewed by me and considered in my medical decision making (see chart for details).  DDX: Asthma, copd, CHF, pna, ptx, malignancy, Pe, anemia   KALID GHAN is a 62 y.o. who presents to the ED with extremity edema pitting  progressively worsening over the past several weeks as well as worsening orthopnea.  Patient in no acute respiratory distress with no hypoxia.  Chest x-ray does show pulmonary vascular congestion but no overt edema.  Will give additional dose of IV Lasix.  EKG shows nonspecific changes troponin is negative.  Do suspect some component of volume overload.  Will reevaluate for adequate diuresis response to IV Lasix to determine inpatient versus outpatient management.  Clinical Course as of Oct 12 1840  Tue Oct 12, 2017  1610 Patient was able to ambulate down the hall and back without any hypoxia.  Does have mild shortness of breath but has had significant diuresis since IV Lasix.   [PR]    Clinical Course User Index [PR] Willy Eddy, MD   I spoke with Dr. Vennie Homans cardiology who kindly agrees to see patient in clinic tomorrow.  I do believe it is appropriate for outpatient management have discussed strict return precautions.  He has had adequate diuresis and has no hypoxia with ambulation.  Have discussed with the patient and available family all diagnostics and treatments performed thus far and all questions were answered  to the best of my ability. The patient demonstrates understanding and agreement with plan.   As part of my medical decision making, I reviewed the following data within the electronic MEDICAL RECORD NUMBER Nursing notes reviewed and incorporated, Labs reviewed, notes from prior ED visits.   ____________________________________________   FINAL CLINICAL IMPRESSION(S) / ED DIAGNOSES  Final diagnoses:  Peripheral edema  Dyspnea, unspecified type      NEW MEDICATIONS STARTED DURING THIS VISIT:  New Prescriptions   No medications on file     Note:  This document was prepared using Dragon voice recognition software and may include unintentional dictation errors.    Willy Eddy, MD 10/12/17 1843

## 2017-10-12 NOTE — ED Notes (Signed)
Pt c/o increased swelling in his LE over the past few days, pt also c/o sinus congestion with SOB.. Pt is in NAD at present.

## 2017-11-22 ENCOUNTER — Other Ambulatory Visit: Payer: Self-pay | Admitting: Otolaryngology

## 2017-11-22 DIAGNOSIS — J339 Nasal polyp, unspecified: Secondary | ICD-10-CM

## 2017-11-26 ENCOUNTER — Ambulatory Visit
Admission: RE | Admit: 2017-11-26 | Discharge: 2017-11-26 | Disposition: A | Discharge status: Home / Self Care | Payer: Medicaid Other | Source: Ambulatory Visit | Attending: Otolaryngology | Admitting: Otolaryngology

## 2017-11-26 DIAGNOSIS — J339 Nasal polyp, unspecified: Secondary | ICD-10-CM

## 2018-07-04 IMAGING — CT CT MAXILLOFACIAL W/O CM
3 of 4 series · 13 of 47 positions shown, 15 images · non-contrast
Comparison: Brain MRI 10/28/2014. head CT 10/27/2014.

CLINICAL DATA: 62-year-old male with nasal polyp.

EXAM:
CT MAXILLOFACIAL WITHOUT CONTRAST
TECHNIQUE: Multidetector CT images of the paranasal sinuses were obtained using
the standard protocol without intravenous contrast.

[Series 2: sinus · axial · 0.30mm/px · z∈[-666,-501]mm · 7 of 101 slices shown, 9 images (1 of 3)]
[im 8/101  brain]
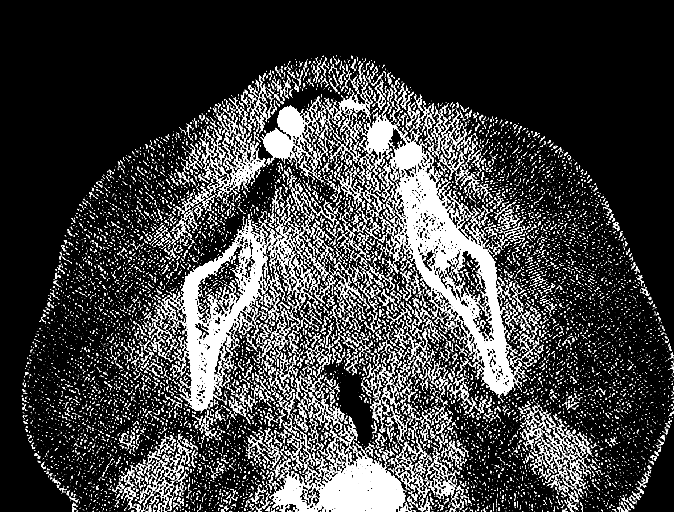
[im 8/101  bone]
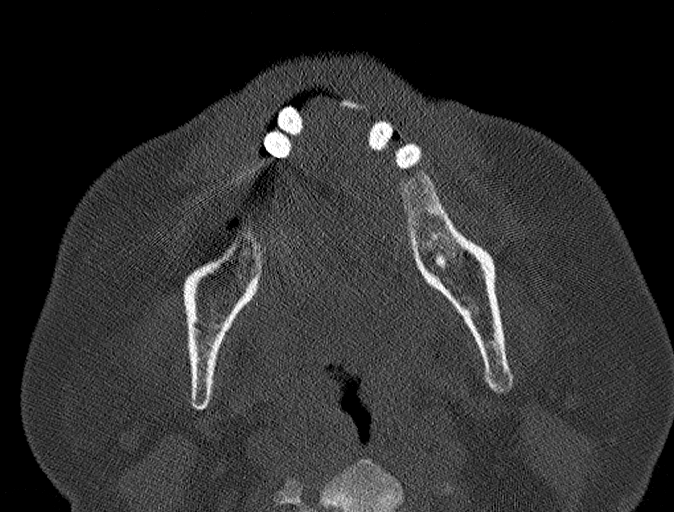
[im 22/101  bone]
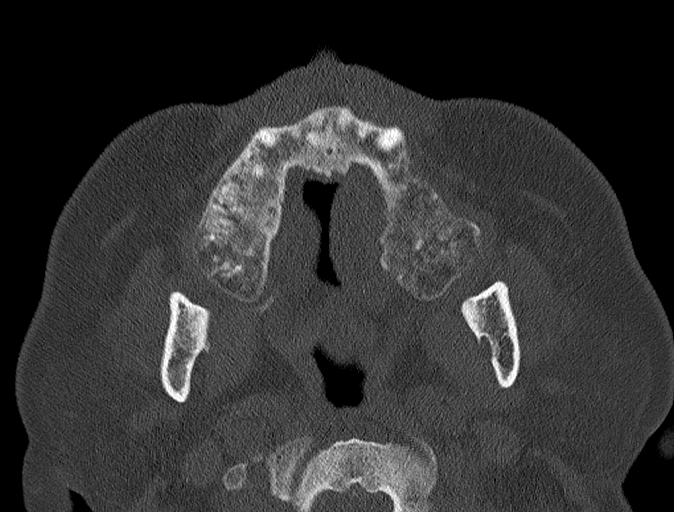
[im 36/101  bone]
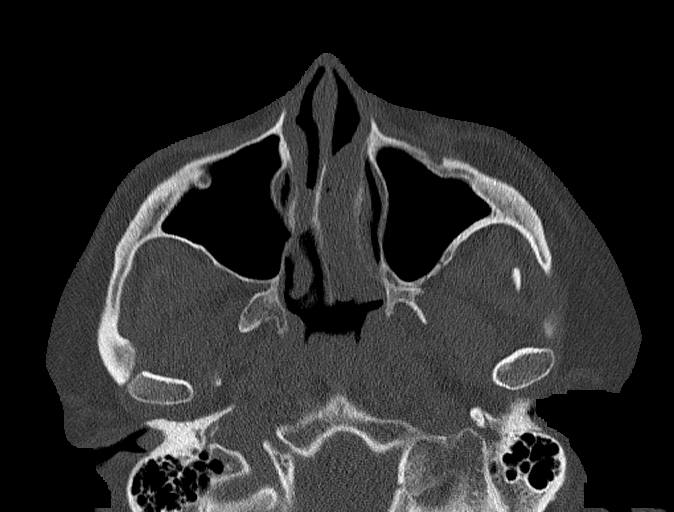
[im 51/101  bone]
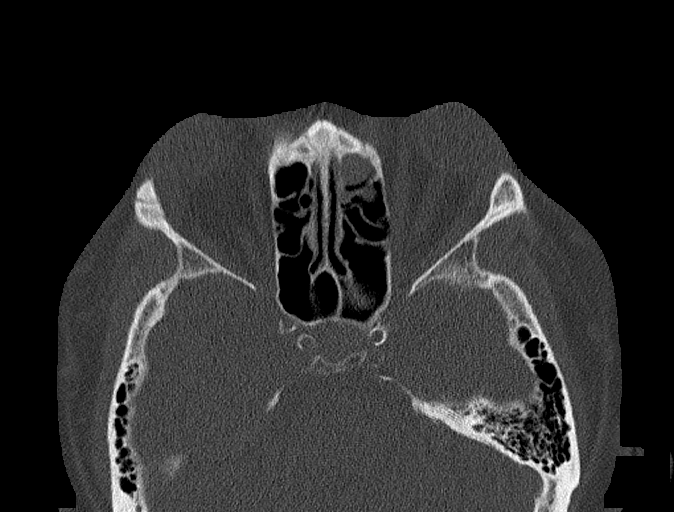
[im 65/101  brain]
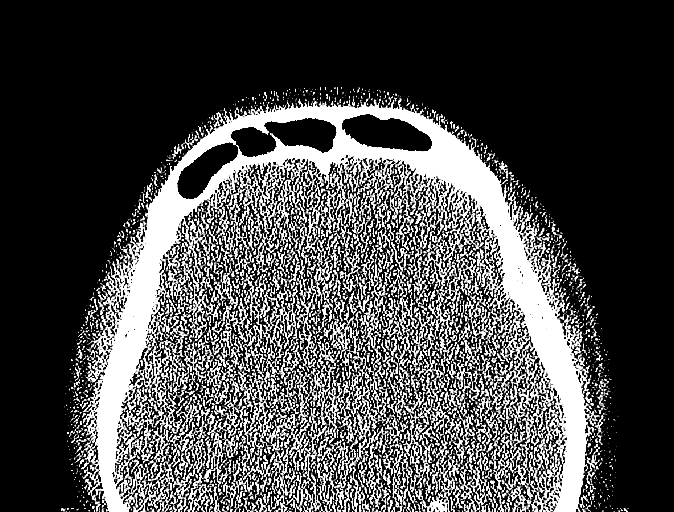
[im 65/101  bone]
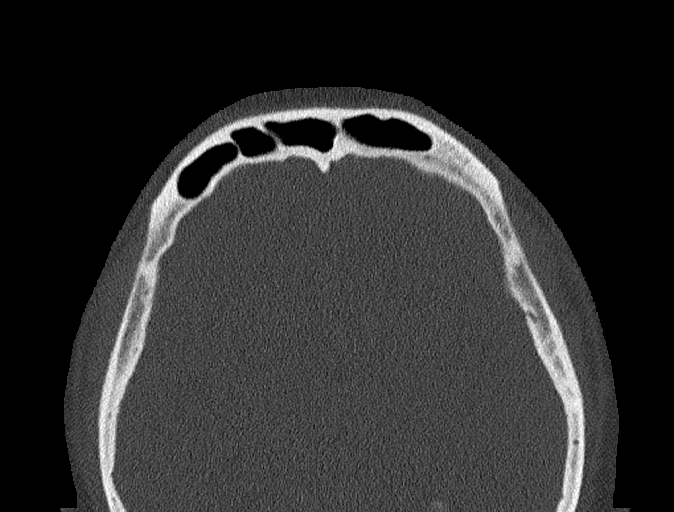
[im 79/101  bone]
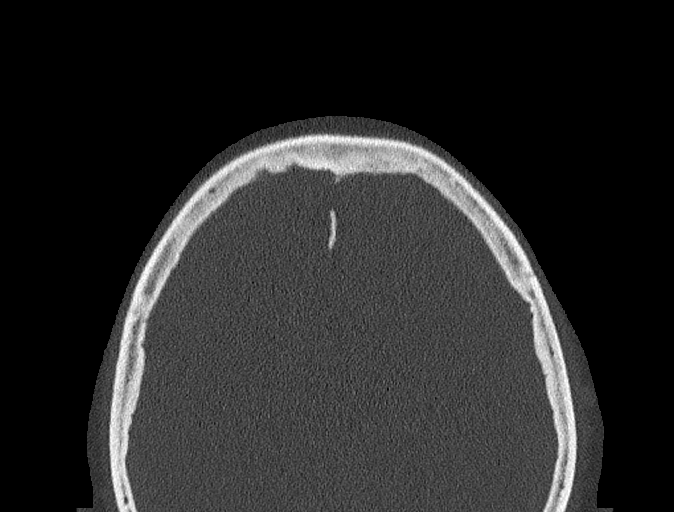
[im 93/101  bone]
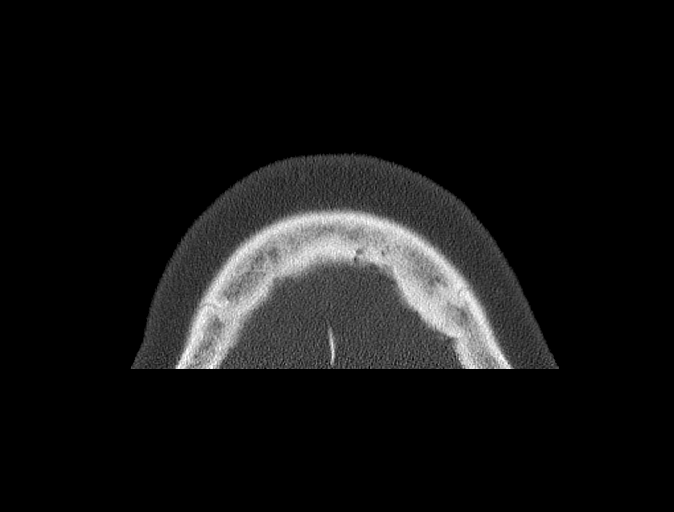

[Series 8: sinus · coronal · 0.38mm/px · 3 of 95 slices shown (2 of 3)]
[im 32/95  bone]
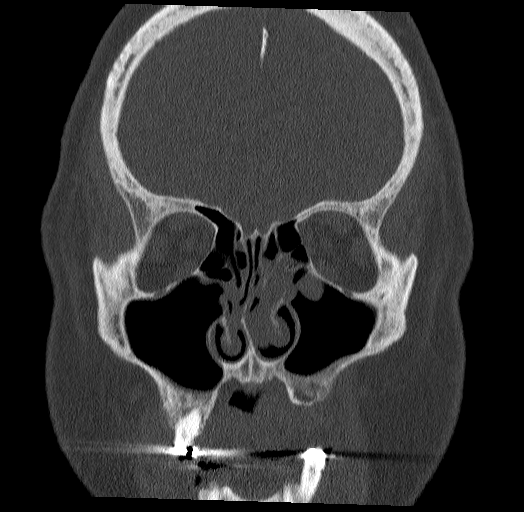
[im 42/95  bone]
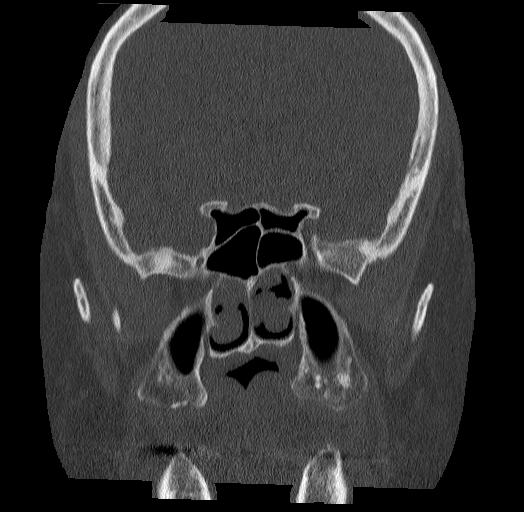
[im 53/95  bone]
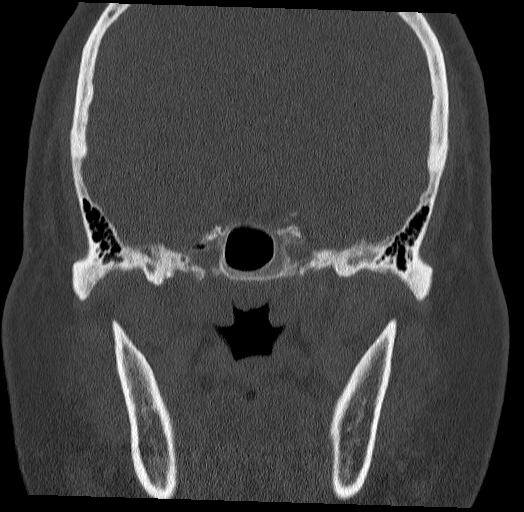

[Series 10: sinus · sagittal · 0.37mm/px · 3 of 100 slices shown (3 of 3)]
[im 34/100  bone]
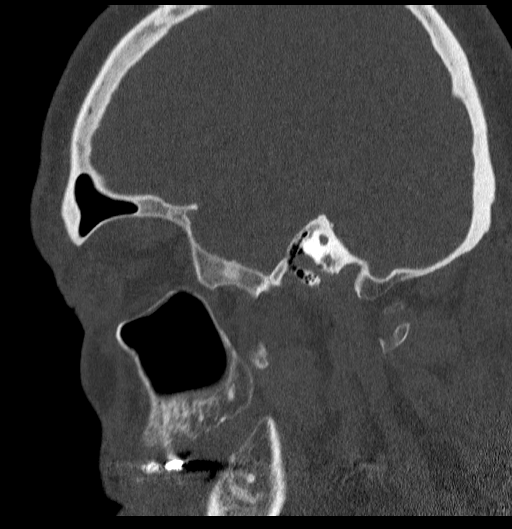
[im 50/100  bone]
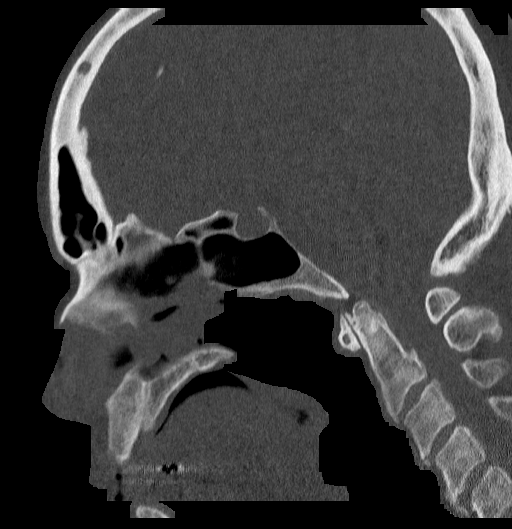
[im 67/100  bone]
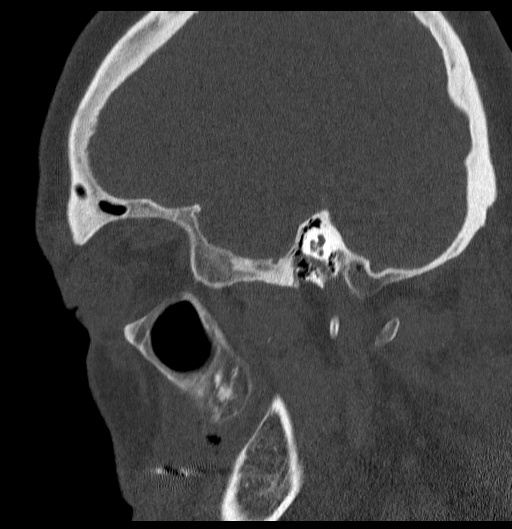

[13 of 47 positions shown; findings below may reference images not displayed]

FINDINGS: Paranasal sinuses:

Frontal: The right frontal sinus is normally aerated with a patent
drainage pathway. There is focal opacification of the left
frontoethmoidal recess which is somewhat polypoid, and chronic since
6631. The remaining left frontal sinus is well pneumatized.

Ethmoid: Chronic mild mucosal thickening on the left. Minimal
mucosal thickening on the right.

Maxillary: Chronic polypoid mucosal thickening at the roof of the
left maxillary sinus. Mild less polypoid mucosal thickening along
the roof of the right maxillary sinus. Possible accessory right
maxillary drainage pathway on coronal image 35.

Sphenoid: Normally aerated. Patent sphenoethmoidal recesses.

Right ostiomeatal unit: Partially opacified due to mucosal
thickening open parental Crohn ule image 27).

Left ostiomeatal unit: Opacified due to mucosal thickening and
middle meatus polypoid lesion (coronal image 30).

Nasal passages: The nasal septum is intact. There is posterior
rightward nasal septal deviation and spurring. Within the left nasal
cavity there is a chronic polypoid opacity which is inseparable from
the septum and the turbinates and was better differentiated from the
turbinates on the 6631 MRI. In 6631 the polyp size was approximately
43 x 13 x 26 millimeters (AP by transverse by CC). Today the
estimated size is estimated size of 47 by 7 x 34 millimeters (AP by
transverse by CC). See sagittal image 51. Mild polypoid opacity also
in the posterior right nasal cavity which is chronic and present in
6631 (series 2, image 65). The olfactory recesses remain relatively
well pneumatized.

Anatomy:

Hyperplastic posterior ethmoid air cells with associated anterior
clinoid process pneumatization (coronal image 42). These are located
superior to the sphenoid sinuses which are only mildly hyperplastic.

Anterior ethmoidal artery position suspected on coronal image 28
with adjacent frontal sinus pneumatization.

Keros type 2 olfactory fossa.

Possible accessory right maxillary sinus drainage pathway on coronal
image 35.

Other: The visible noncontrast brain parenchyma appears stable since
6631. Calcified atherosclerosis at the skull base.

Visualized orbits and scalp soft tissues are within normal limits.
Negative visible noncontrast deep soft tissue spaces of the face
(partially retropharyngeal course of the carotids).

The bilateral tympanic cavities and mastoid air cells are clear.

No acute osseous abnormality identified.
IMPRESSION: 1. Chronic sinonasal polyposis suspected. Dominant chronic polyp in
the left nasal cavity measures up to 47 millimeters in length and
was better differentiated from the septum and turbinates on the 6631
brain MRI.
Chronic small polyps suspected in the posterior right nasal cavity,
left maxillary sinus, left frontoethmoidal recess.
No septal or bone destruction.
2. Relatively mild associated paranasal sinus mucosal thickening,
maximal in the left ethmoids and both OMCs.
3. Bilateral tympanic cavities and mastoids remain clear.

## 2018-07-27 ENCOUNTER — Other Ambulatory Visit: Payer: Self-pay

## 2018-07-27 ENCOUNTER — Emergency Department
Admission: EM | Admit: 2018-07-27 | Discharge: 2018-07-27 | Disposition: A | Discharge status: Home / Self Care | Payer: Medicaid Other | Attending: Emergency Medicine | Admitting: Emergency Medicine

## 2018-07-27 DIAGNOSIS — R030 Elevated blood-pressure reading, without diagnosis of hypertension: Secondary | ICD-10-CM | POA: Insufficient documentation

## 2018-07-27 DIAGNOSIS — R0981 Nasal congestion: Secondary | ICD-10-CM | POA: Insufficient documentation

## 2018-07-27 DIAGNOSIS — I10 Essential (primary) hypertension: Secondary | ICD-10-CM | POA: Insufficient documentation

## 2018-07-27 MED ORDER — FLUTICASONE PROPIONATE 50 MCG/ACT NA SUSP: 1.0000 | Freq: Two times a day (BID) | NASAL | 0 refills | Status: AC

## 2018-07-27 MED ORDER — SALINE SPRAY 0.65 % NA SOLN: 2.0000 | NASAL | 1 refills | Status: AC | PRN

## 2018-07-27 NOTE — Discharge Instructions (Signed)
Keep your appointment with your primary care provider on your schedule appointment.  Have nursing staff check your blood pressure again as as it was elevated in the emergency department today.  Continue your regular medication.  Also your Flonase medication has been altered to be 1 spray to each nostril twice a day.  You may use the Ocean mist nasal spray as often as needed for nasal congestion.

## 2018-07-27 NOTE — ED Provider Notes (Signed)
[facility] Emergency Department Provider Note  ____________________________________________   First MD Initiated Contact with Patient 07/27/18 1801     (approximate)  I have reviewed the triage vital signs and the nursing notes.   HISTORY  Chief Complaint Nasal Congestion Sister   HPI James Mcbride is a 63 y.o. male   presents to the ED with complaint of nasal congestion.  Patient has an appointment with his PCP on Monday but his sister did not want him to wait that long and is here with him.  She states that part of his nasal congestion is because of polyps.  There is no history of fever.  Patient currently lives in a assisted facility and sister is not sure that he is getting his medicine correctly but patient reports that he is taking his blood pressure medication.   Past Medical History:  Diagnosis Date  . Hypertension   . Seizures (HCC)   . Stroke (HCC) 2016  . Swelling     Patient Active Problem List   Diagnosis Date Noted  . TIA (transient ischemic attack) 10/27/2014    History reviewed. No pertinent surgical history.  Prior to Admission medications   Medication Sig Start Date End Date Taking? Authorizing Provider  amLODipine (NORVASC) 10 MG tablet Take 10 mg by mouth daily.    [provider]  aspirin 81 MG tablet Take 81 mg by mouth daily.    [provider]  atorvastatin (LIPITOR) 10 MG tablet Take 10 mg by mouth daily.    [provider]  doxazosin (CARDURA) 8 MG tablet Take 4 mg by mouth at bedtime.     [provider]  fluticasone (FLONASE) 50 MCG/ACT nasal spray Place 1 spray into both nostrils 2 (two) times daily. 07/27/18   Tommi Rumps, PA-C  furosemide (LASIX) 20 MG tablet Take 1 tablet (20 mg total) by mouth daily. 07/18/16 07/18/17  Emily Filbert, MD  furosemide (LASIX) 40 MG tablet Take 1 tablet (40 mg total) by mouth daily. 07/18/16 07/18/17  Emily Filbert, MD  hydrALAZINE  (APRESOLINE) 25 MG tablet Take 1 tablet (25 mg total) by mouth 3 (three) times daily. Patient taking differently: Take 100 mg by mouth 3 (three) times daily.  10/28/14   Adrian Saran, MD  levETIRAcetam (KEPPRA) 750 MG tablet Take 750 mg by mouth 2 (two) times daily.    [provider]  PHENobarbital (LUMINAL) 97.2 MG tablet Take 97.2 mg by mouth at bedtime.     [provider]  phenytoin (DILANTIN) 100 MG ER capsule Take 100 mg by mouth 2 (two) times daily.     [provider]  senna (SENOKOT) 8.6 MG TABS tablet Take 1 tablet by mouth.    [provider]  simvastatin (ZOCOR) 20 MG tablet Take 1 tablet (20 mg total) by mouth daily. Patient not taking: Reported on 07/18/2016 10/28/14   Adrian Saran, MD  sodium chloride (OCEAN) 0.65 % SOLN nasal spray Place 2 sprays into both nostrils as needed for congestion. 07/27/18   Tommi Rumps, PA-C    Allergies Vasotec [enalaprilat]  Family History  Problem Relation Age of Onset  . Kidney disease Mother   . Diabetes Mother   . Cancer Other     Social History Social History   Tobacco Use  . Smoking status: Never Smoker  . Smokeless tobacco: Never Used  Substance Use Topics  . Alcohol use: No  . Drug use: No  Review of Systems Constitutional: No fever/chills Eyes: No visual changes. ENT: No sore throat.  Positive nasal congestion. Cardiovascular: Denies chest pain. Respiratory: Denies shortness of breath. Gastrointestinal: No abdominal pain.  No nausea, no vomiting. Musculoskeletal: Negative for back pain. Skin: Negative for rash. Neurological: Negative for headaches, focal weakness or numbness. ____________________________________________   PHYSICAL EXAM:  VITAL SIGNS: ED Triage Vitals  Enc Vitals Group     BP 07/27/18 1742 (!) 205/85     Pulse Rate 07/27/18 1742 (!) 56     Resp 07/27/18 1742 18     Temp 07/27/18 1742 97.6 F (36.4 C)     Temp Source 07/27/18 1742 Oral     SpO2 07/27/18  1742 96 %     Weight 07/27/18 1743 (!) 380 lb (172.4 kg)     Height 07/27/18 1743 5\' 7"  (1.702 m)     Head Circumference --      Peak Flow --      Pain Score 07/27/18 1742 5     Pain Loc --      Pain Edu? --      Excl. in GC? --     Constitutional: Alert and oriented. Well appearing and in no acute distress. Eyes: Conjunctivae are normal.  Head: Atraumatic. Nose: Moderate white thick nasal mucus. Mouth/Throat: Mucous membranes are moist.  Oropharynx non-erythematous. Neck: No stridor.   Hematological/Lymphatic/Immunilogical: No cervical lymphadenopathy. Cardiovascular: Normal rate, regular rhythm. Grossly normal heart sounds.  Good peripheral circulation. Respiratory: Normal respiratory effort.  No retractions. Lungs CTAB. Skin:  Skin is warm, dry and intact. No rash noted. Psychiatric: Mood and affect are normal. Speech and behavior are normal.  ____________________________________________   LABS (all labs ordered are listed, but only abnormal results are displayed)  Labs Reviewed - No data to display ____________________________________________   PROCEDURES  Procedure(s) performed: None  Procedures  Critical Care performed: No  ____________________________________________   INITIAL IMPRESSION / ASSESSMENT AND PLAN / ED COURSE  As part of my medical decision making, I reviewed the following data within the electronic MEDICAL RECORD NUMBER Notes from prior ED visits and [place] Controlled Substance Database  Patient is brought to the ED by sister with complaint of nasal congestion.  On arrival patient's blood pressure was elevated but patient and sister state that he is getting his blood pressure medication at the nursing facility.  He also has an appointment with his PCP on Monday.  Sister states he has had problems with nasal congestion and including nasal polyps.  In looking through his records he has been seen also at [facility] for nasal congestion.  He was placed on Flonase and  Ocean mist nasal spray.  Currently he is getting Flonase once a day but sister is not certain of the consistency of this.  Patient medication was changed to Flonase 1 spray each nostril twice a day and to begin using Ocean mist nasal spray to help with the mucus.  They will be keeping their appointment Monday with his PCP to go over medications again.  ____________________________________________   FINAL CLINICAL IMPRESSION(S) / ED DIAGNOSES  Final diagnoses:  Nasal congestion  Elevated blood pressure reading     ED Discharge Orders         Ordered    fluticasone (FLONASE) 50 MCG/ACT nasal spray  2 times daily     07/27/18 1924    sodium chloride (OCEAN) 0.65 % SOLN nasal spray  As needed     07/27/18 1924  Note:  This document was prepared using Dragon voice recognition software and may include unintentional dictation errors.    Tommi RumpsSummers, Tramaine Snell L, PA-C 07/28/18 1200    Minna AntisPaduchowski, Kevin, MD 07/28/18 [phone]

## 2018-07-27 NOTE — ED Triage Notes (Signed)
Pt sister talking for pt. States congestion. Has appt on Monday with PCP but sister didn't want to wait. Also sees ENT for nasal polyps. Pt states throat hurts.

## 2018-11-13 ENCOUNTER — Encounter: Payer: Self-pay | Admitting: Emergency Medicine

## 2018-11-13 ENCOUNTER — Emergency Department
Admission: EM | Admit: 2018-11-13 | Discharge: 2018-11-14 | Disposition: A | Discharge status: Home / Self Care | Payer: Medicaid Other | Attending: Emergency Medicine | Admitting: Emergency Medicine

## 2018-11-13 ENCOUNTER — Emergency Department: Payer: Medicaid Other

## 2018-11-13 DIAGNOSIS — W010XXA Fall on same level from slipping, tripping and stumbling without subsequent striking against object, initial encounter: Secondary | ICD-10-CM | POA: Diagnosis not present

## 2018-11-13 DIAGNOSIS — I10 Essential (primary) hypertension: Secondary | ICD-10-CM | POA: Insufficient documentation

## 2018-11-13 DIAGNOSIS — R600 Localized edema: Secondary | ICD-10-CM | POA: Diagnosis not present

## 2018-11-13 DIAGNOSIS — W19XXXA Unspecified fall, initial encounter: Secondary | ICD-10-CM

## 2018-11-13 DIAGNOSIS — R609 Edema, unspecified: Secondary | ICD-10-CM

## 2018-11-13 DIAGNOSIS — M25522 Pain in left elbow: Secondary | ICD-10-CM | POA: Insufficient documentation

## 2018-11-13 DIAGNOSIS — Z8673 Personal history of transient ischemic attack (TIA), and cerebral infarction without residual deficits: Secondary | ICD-10-CM | POA: Diagnosis not present

## 2018-11-13 DIAGNOSIS — Z7982 Long term (current) use of aspirin: Secondary | ICD-10-CM | POA: Insufficient documentation

## 2018-11-13 DIAGNOSIS — Z79899 Other long term (current) drug therapy: Secondary | ICD-10-CM | POA: Diagnosis not present

## 2018-11-13 NOTE — ED Triage Notes (Signed)
Pt to ED from the [facility] after unwitnessed fall. Pt states slipped on bathroom floor and landed on left arm. Pt has c/o of pain to left arm and denies LOC.

## 2018-11-13 NOTE — ED Provider Notes (Signed)
[facility] Emergency Department Provider Note   ____________________________________________   I have reviewed the triage vital signs and the nursing notes.   HISTORY  Chief Complaint Left elbow pain  History limited by: Not Limited   HPI James Mcbride is a 63 y.o. male who presents to the emergency department today because of concern for left elbow pain after a fall. The patient states that he was in the bathroom when he slipped on the floor and fell. The patient states that he landed on his left elbow. Denies any other injury. Denies hitting his head. He denies any dizziness or lightheadedness before the fall. Denies any recent fevers or illness.    Records reviewed. Per medical record review patient has a history of swelling, hypertension.   Past Medical History:  Diagnosis Date  . Hypertension   . Seizures (HCC)   . Stroke (HCC) 2016  . Swelling     Patient Active Problem List   Diagnosis Date Noted  . TIA (transient ischemic attack) 10/27/2014    No past surgical history on file.  Prior to Admission medications   Medication Sig Start Date End Date Taking? Authorizing Provider  amLODipine (NORVASC) 10 MG tablet Take 10 mg by mouth daily.    [provider]  aspirin 81 MG tablet Take 81 mg by mouth daily.    [provider]  atorvastatin (LIPITOR) 10 MG tablet Take 10 mg by mouth daily.    [provider]  doxazosin (CARDURA) 8 MG tablet Take 4 mg by mouth at bedtime.     [provider]  fluticasone (FLONASE) 50 MCG/ACT nasal spray Place 1 spray into both nostrils 2 (two) times daily. 07/27/18   Tommi Rumps, PA-C  furosemide (LASIX) 20 MG tablet Take 1 tablet (20 mg total) by mouth daily. 07/18/16 07/18/17  Emily Filbert, MD  furosemide (LASIX) 40 MG tablet Take 1 tablet (40 mg total) by mouth daily. 07/18/16 07/18/17  Emily Filbert, MD  hydrALAZINE (APRESOLINE) 25 MG tablet Take 1 tablet  (25 mg total) by mouth 3 (three) times daily. Patient taking differently: Take 100 mg by mouth 3 (three) times daily.  10/28/14   Adrian Saran, MD  levETIRAcetam (KEPPRA) 750 MG tablet Take 750 mg by mouth 2 (two) times daily.    [provider]  PHENobarbital (LUMINAL) 97.2 MG tablet Take 97.2 mg by mouth at bedtime.     [provider]  phenytoin (DILANTIN) 100 MG ER capsule Take 100 mg by mouth 2 (two) times daily.     [provider]  senna (SENOKOT) 8.6 MG TABS tablet Take 1 tablet by mouth.    [provider]  simvastatin (ZOCOR) 20 MG tablet Take 1 tablet (20 mg total) by mouth daily. Patient not taking: Reported on 07/18/2016 10/28/14   Adrian Saran, MD  sodium chloride (OCEAN) 0.65 % SOLN nasal spray Place 2 sprays into both nostrils as needed for congestion. 07/27/18   Tommi Rumps, PA-C    Allergies Vasotec [enalaprilat]  Family History  Problem Relation Age of Onset  . Kidney disease Mother   . Diabetes Mother   . Cancer Other     Social History Social History   Tobacco Use  . Smoking status: Never Smoker  . Smokeless tobacco: Never Used  Substance Use Topics  . Alcohol use: No  . Drug use: No    Review of Systems Constitutional: No fever/chills Eyes: No visual changes. ENT: No  sore throat. Cardiovascular: Denies chest pain. Respiratory: Denies shortness of breath. Gastrointestinal: No abdominal pain.  No nausea, no vomiting.  No diarrhea.   Genitourinary: Negative for dysuria. Musculoskeletal: Positive for left elbow pian.  Skin: Negative for rash. Neurological: Negative for headaches, focal weakness or numbness.  ____________________________________________   PHYSICAL EXAM:   Constitutional: Alert and oriented.  Eyes: Conjunctivae are normal.  ENT      Head: Normocephalic and atraumatic.      Nose: No congestion/rhinnorhea.      Mouth/Throat: Mucous membranes are moist.      Neck: No  stridor. Hematological/Lymphatic/Immunilogical: No cervical lymphadenopathy. Cardiovascular: Normal rate, regular rhythm.  No murmurs, rubs, or gallops. Respiratory: Normal respiratory effort without tachypnea nor retractions. Breath sounds are clear and equal bilaterally. No wheezes/rales/rhonchi. Gastrointestinal: Soft and non tender. No rebound. No guarding.  Genitourinary: Deferred Musculoskeletal: No obvious deformity to the left elbow. Mild tenderness to palpation over the olecranon. Significant edema to upper and lower extremities.  Neurologic:  Normal speech and language. No gross focal neurologic deficits are appreciated.  Skin:  Skin is warm, dry and intact. No rash noted. Psychiatric: Mood and affect are normal. Speech and behavior are normal. Patient exhibits appropriate insight and judgment.  ____________________________________________    LABS (pertinent positives/negatives)  None  ____________________________________________   EKG  None  ____________________________________________    RADIOLOGY  Left elbow x-ray No acute osseous injury  ____________________________________________   PROCEDURES  Procedures  ____________________________________________   INITIAL IMPRESSION / ASSESSMENT AND PLAN / ED COURSE  Pertinent labs & imaging results that were available during my care of the patient were reviewed by me and considered in my medical decision making (see chart for details).    Patient presented to the emergency department today because of concerns for left elbow pain after a fall.  Patient states that he slipped on the bathroom floor.  Denies any dizziness or lightheadedness prior to the fall.  On exam he did have some tenderness to the elbow so an x-ray was performed.  This did not show any acute osseous injury.  Discussed this finding with the patient.  Additionally the patient was noted to have some significant peripheral edema.  Patient states he  has a history of edema.  Denies any shortness of breath.  Discussed with patient portance of following up with primary care and will give patient heart failure clinic information.  ____________________________________________   FINAL CLINICAL IMPRESSION(S) / ED DIAGNOSES  Final diagnoses:  Fall, initial encounter  Left elbow pain  Peripheral edema     Note: This dictation was prepared with Dragon dictation. Any transcriptional errors that result from this process are unintentional     Phineas SemenGoodman, Ellagrace Yoshida, MD 11/13/18 [phone]

## 2018-11-13 NOTE — Discharge Instructions (Addendum)
Please seek medical attention for any high fevers, chest pain, shortness of breath, change in behavior, persistent vomiting, bloody stool or any other new or concerning symptoms.  

## 2018-11-14 NOTE — ED Notes (Signed)
Pt leaving with EMS att

## 2018-11-14 NOTE — ED Notes (Signed)
No peripheral IV placed this visit.    Discharge instructions reviewed with patient. Questions fielded by this RN. Patient verbalizes understanding of instructions. Patient discharged home in stable condition per goodman. No acute distress noted at time of discharge.    

## 2018-11-14 NOTE — ED Notes (Signed)
Pt bedding changed, diaper applied but poor fit, DC plan discussed

## 2018-11-18 ENCOUNTER — Emergency Department: Payer: Medicaid Other

## 2018-11-18 ENCOUNTER — Inpatient Hospital Stay
Admission: EM | Admit: 2018-11-18 | Discharge: 2018-11-24 | DRG: 291 | Disposition: A | Discharge status: Skilled Nursing Facility | Payer: Medicaid Other | Source: Skilled Nursing Facility | Attending: Internal Medicine | Admitting: Internal Medicine

## 2018-11-18 ENCOUNTER — Other Ambulatory Visit: Payer: Self-pay

## 2018-11-18 ENCOUNTER — Encounter: Payer: Self-pay | Admitting: Emergency Medicine

## 2018-11-18 DIAGNOSIS — J9622 Acute and chronic respiratory failure with hypercapnia: Secondary | ICD-10-CM | POA: Diagnosis present

## 2018-11-18 DIAGNOSIS — R739 Hyperglycemia, unspecified: Secondary | ICD-10-CM | POA: Diagnosis present

## 2018-11-18 DIAGNOSIS — R296 Repeated falls: Secondary | ICD-10-CM | POA: Diagnosis present

## 2018-11-18 DIAGNOSIS — Z8673 Personal history of transient ischemic attack (TIA), and cerebral infarction without residual deficits: Secondary | ICD-10-CM

## 2018-11-18 DIAGNOSIS — J9621 Acute and chronic respiratory failure with hypoxia: Secondary | ICD-10-CM | POA: Diagnosis present

## 2018-11-18 DIAGNOSIS — Z1159 Encounter for screening for other viral diseases: Secondary | ICD-10-CM | POA: Diagnosis not present

## 2018-11-18 DIAGNOSIS — Z6841 Body Mass Index (BMI) 40.0 and over, adult: Secondary | ICD-10-CM | POA: Diagnosis not present

## 2018-11-18 DIAGNOSIS — S53105A Unspecified dislocation of left ulnohumeral joint, initial encounter: Secondary | ICD-10-CM

## 2018-11-18 DIAGNOSIS — R52 Pain, unspecified: Secondary | ICD-10-CM | POA: Diagnosis present

## 2018-11-18 DIAGNOSIS — Z7982 Long term (current) use of aspirin: Secondary | ICD-10-CM

## 2018-11-18 DIAGNOSIS — Z79899 Other long term (current) drug therapy: Secondary | ICD-10-CM

## 2018-11-18 DIAGNOSIS — I110 Hypertensive heart disease with heart failure: Secondary | ICD-10-CM | POA: Diagnosis present

## 2018-11-18 DIAGNOSIS — I509 Heart failure, unspecified: Secondary | ICD-10-CM

## 2018-11-18 DIAGNOSIS — G4733 Obstructive sleep apnea (adult) (pediatric): Secondary | ICD-10-CM | POA: Diagnosis present

## 2018-11-18 DIAGNOSIS — G9340 Encephalopathy, unspecified: Secondary | ICD-10-CM | POA: Diagnosis present

## 2018-11-18 DIAGNOSIS — W19XXXA Unspecified fall, initial encounter: Secondary | ICD-10-CM | POA: Diagnosis present

## 2018-11-18 DIAGNOSIS — D649 Anemia, unspecified: Secondary | ICD-10-CM | POA: Diagnosis present

## 2018-11-18 DIAGNOSIS — Z9119 Patient's noncompliance with other medical treatment and regimen: Secondary | ICD-10-CM

## 2018-11-18 DIAGNOSIS — Z888 Allergy status to other drugs, medicaments and biological substances status: Secondary | ICD-10-CM

## 2018-11-18 DIAGNOSIS — T148XXA Other injury of unspecified body region, initial encounter: Secondary | ICD-10-CM

## 2018-11-18 DIAGNOSIS — G40909 Epilepsy, unspecified, not intractable, without status epilepticus: Secondary | ICD-10-CM | POA: Diagnosis present

## 2018-11-18 DIAGNOSIS — I5033 Acute on chronic diastolic (congestive) heart failure: Secondary | ICD-10-CM | POA: Diagnosis present

## 2018-11-18 DIAGNOSIS — S53125A Posterior dislocation of left ulnohumeral joint, initial encounter: Secondary | ICD-10-CM | POA: Diagnosis present

## 2018-11-18 DIAGNOSIS — R0902 Hypoxemia: Secondary | ICD-10-CM

## 2018-11-18 LAB — CBC WITH DIFFERENTIAL/PLATELET
Abs Immature Granulocytes: 0.02 10*3/uL (ref 0.00–0.07)
Basophils Absolute: 0 10*3/uL (ref 0.0–0.1)
Basophils Relative: 1 %
Eosinophils Absolute: 0.1 10*3/uL (ref 0.0–0.5)
Eosinophils Relative: 1 %
HCT: 31.8 % — ABNORMAL LOW (ref 39.0–52.0)
Hemoglobin: 10.1 g/dL — ABNORMAL LOW (ref 13.0–17.0)
Immature Granulocytes: 0 %
Lymphocytes Relative: 34 %
Lymphs Abs: 1.8 10*3/uL (ref 0.7–4.0)
MCH: 26.6 pg (ref 26.0–34.0)
MCHC: 31.8 g/dL (ref 30.0–36.0)
MCV: 83.9 fL (ref 80.0–100.0)
Monocytes Absolute: 0.5 10*3/uL (ref 0.1–1.0)
Monocytes Relative: 9 %
Neutro Abs: 3 10*3/uL (ref 1.7–7.7)
Neutrophils Relative %: 55 %
Platelets: 130 10*3/uL — ABNORMAL LOW (ref 150–400)
RBC: 3.79 MIL/uL — ABNORMAL LOW (ref 4.22–5.81)
RDW: 15.5 % (ref 11.5–15.5)
WBC: 5.4 10*3/uL (ref 4.0–10.5)
nRBC: 0 % (ref 0.0–0.2)

## 2018-11-18 LAB — COMPREHENSIVE METABOLIC PANEL
ALT: 16 U/L (ref 0–44)
AST: 18 U/L (ref 15–41)
Albumin: 3.6 g/dL (ref 3.5–5.0)
Alkaline Phosphatase: 112 U/L (ref 38–126)
Anion gap: 7 (ref 5–15)
BUN: 18 mg/dL (ref 8–23)
CO2: 26 mmol/L (ref 22–32)
Calcium: 10.6 mg/dL — ABNORMAL HIGH (ref 8.9–10.3)
Chloride: 105 mmol/L (ref 98–111)
Creatinine, Ser: 0.89 mg/dL (ref 0.61–1.24)
GFR calc Af Amer: >60 mL/min (ref >60)
GFR calc non Af Amer: >60 mL/min (ref >60)
Glucose, Bld: 127 mg/dL — ABNORMAL HIGH (ref 70–99)
Potassium: 4 mmol/L (ref 3.5–5.1)
Sodium: 138 mmol/L (ref 135–145)
Total Bilirubin: 0.4 mg/dL (ref 0.3–1.2)
Total Protein: 6.8 g/dL (ref 6.5–8.1)

## 2018-11-18 LAB — SARS CORONAVIRUS 2 BY RT PCR (HOSPITAL ORDER, PERFORMED IN ~~LOC~~ HOSPITAL LAB): SARS Coronavirus 2: NEGATIVE

## 2018-11-18 LAB — BRAIN NATRIURETIC PEPTIDE: B Natriuretic Peptide: 205 pg/mL — ABNORMAL HIGH (ref 0.0–100.0)

## 2018-11-18 MED ORDER — ISOSORBIDE MONONITRATE ER 30 MG PO TB24: 30.0000 mg | ORAL_TABLET | Freq: Every day | ORAL | Status: DC

## 2018-11-18 MED ORDER — ENOXAPARIN SODIUM 40 MG/0.4ML ~~LOC~~ SOLN
40.0000 mg | Freq: Two times a day (BID) | SUBCUTANEOUS | Status: DC
  Administered 2018-11-18 – 2018-11-24 (×12): 40 mg via SUBCUTANEOUS
  Filled 2018-11-18 (×12): qty 0.4

## 2018-11-18 MED ORDER — SODIUM CHLORIDE 0.9% FLUSH
3.0000 mL | Freq: Two times a day (BID) | INTRAVENOUS | Status: DC
  Administered 2018-11-18 – 2018-11-24 (×13): 3 mL via INTRAVENOUS

## 2018-11-18 MED ORDER — AMLODIPINE BESYLATE 10 MG PO TABS: 10.0000 mg | ORAL_TABLET | Freq: Every day | ORAL | Status: DC

## 2018-11-18 MED ORDER — FUROSEMIDE 10 MG/ML IJ SOLN
60.0000 mg | Freq: Once | INTRAMUSCULAR | Status: AC
  Administered 2018-11-18: 12:00:00 60 mg via INTRAVENOUS
  Filled 2018-11-18: qty 8

## 2018-11-18 MED ORDER — ACETAMINOPHEN 325 MG PO TABS
650.0000 mg | ORAL_TABLET | ORAL | Status: DC | PRN
  Administered 2018-11-20 – 2018-11-23 (×3): 650 mg via ORAL
  Filled 2018-11-18 (×3): qty 2

## 2018-11-18 MED ORDER — FUROSEMIDE 10 MG/ML IJ SOLN
60.0000 mg | Freq: Two times a day (BID) | INTRAMUSCULAR | Status: DC
  Administered 2018-11-18 – 2018-11-21 (×6): 60 mg via INTRAVENOUS
  Filled 2018-11-18 (×6): qty 6

## 2018-11-18 MED ORDER — PHENOBARBITAL 97.2 MG PO TABS
97.2000 mg | ORAL_TABLET | Freq: Every day | ORAL | Status: DC
  Administered 2018-11-18: 23:00:00 97.2 mg via ORAL
  Filled 2018-11-18 (×2): qty 1

## 2018-11-18 MED ORDER — HYDRALAZINE HCL 50 MG PO TABS
100.0000 mg | ORAL_TABLET | Freq: Three times a day (TID) | ORAL | Status: DC
  Filled 2018-11-18: qty 2

## 2018-11-18 MED ORDER — MICONAZOLE NITRATE 2 % EX CREA
1.0000 "application " | TOPICAL_CREAM | Freq: Two times a day (BID) | CUTANEOUS | Status: DC
  Administered 2018-11-19 – 2018-11-24 (×9): 1 via TOPICAL
  Filled 2018-11-18 (×2): qty 14

## 2018-11-18 MED ORDER — ATORVASTATIN CALCIUM 10 MG PO TABS
10.0000 mg | ORAL_TABLET | Freq: Every day | ORAL | Status: DC
  Administered 2018-11-19 – 2018-11-24 (×6): 10 mg via ORAL
  Filled 2018-11-18 (×6): qty 1

## 2018-11-18 MED ORDER — DOXAZOSIN MESYLATE 8 MG PO TABS
8.0000 mg | ORAL_TABLET | Freq: Every day | ORAL | Status: DC
  Administered 2018-11-18: 8 mg via ORAL
  Filled 2018-11-18 (×2): qty 1

## 2018-11-18 MED ORDER — LABETALOL HCL 100 MG PO TABS
100.0000 mg | ORAL_TABLET | Freq: Two times a day (BID) | ORAL | Status: DC
  Filled 2018-11-18 (×2): qty 1

## 2018-11-18 MED ORDER — SODIUM CHLORIDE 0.9% FLUSH: 3.0000 mL | INTRAVENOUS | Status: DC | PRN

## 2018-11-18 MED ORDER — SODIUM CHLORIDE 0.9 % IV SOLN
250.0000 mL | INTRAVENOUS | Status: DC | PRN
  Administered 2018-11-22 – 2018-11-23 (×3): 250 mL via INTRAVENOUS

## 2018-11-18 MED ORDER — ASPIRIN 81 MG PO CHEW
81.0000 mg | CHEWABLE_TABLET | Freq: Every day | ORAL | Status: DC
  Administered 2018-11-19 – 2018-11-24 (×6): 81 mg via ORAL
  Filled 2018-11-18 (×7): qty 1

## 2018-11-18 MED ORDER — ENOXAPARIN SODIUM 40 MG/0.4ML ~~LOC~~ SOLN: 40.0000 mg | SUBCUTANEOUS | Status: DC

## 2018-11-18 MED ORDER — LEVETIRACETAM 750 MG PO TABS
750.0000 mg | ORAL_TABLET | Freq: Two times a day (BID) | ORAL | Status: DC
  Administered 2018-11-18 – 2018-11-19 (×2): 750 mg via ORAL
  Filled 2018-11-18 (×4): qty 1

## 2018-11-18 MED ORDER — MORPHINE SULFATE (PF) 2 MG/ML IV SOLN
2.0000 mg | Freq: Once | INTRAVENOUS | Status: AC
  Administered 2018-11-18: 10:00:00 2 mg via INTRAVENOUS
  Filled 2018-11-18: qty 1

## 2018-11-18 MED ORDER — SALINE SPRAY 0.65 % NA SOLN
2.0000 | NASAL | Status: DC | PRN
  Filled 2018-11-18: qty 44

## 2018-11-18 MED ORDER — CARVEDILOL 25 MG PO TABS: 25.0000 mg | ORAL_TABLET | Freq: Two times a day (BID) | ORAL | Status: DC

## 2018-11-18 MED ORDER — ONDANSETRON HCL 4 MG/2ML IJ SOLN: 4.0000 mg | Freq: Four times a day (QID) | INTRAMUSCULAR | Status: DC | PRN

## 2018-11-18 MED ORDER — ONDANSETRON HCL 4 MG/2ML IJ SOLN
4.0000 mg | Freq: Once | INTRAMUSCULAR | Status: AC
  Administered 2018-11-18: 10:00:00 4 mg via INTRAVENOUS
  Filled 2018-11-18: qty 2

## 2018-11-18 MED ORDER — FLUTICASONE PROPIONATE 50 MCG/ACT NA SUSP
1.0000 | Freq: Two times a day (BID) | NASAL | Status: DC
  Administered 2018-11-18 – 2018-11-24 (×10): 1 via NASAL
  Filled 2018-11-18: qty 16

## 2018-11-18 NOTE — Consult Note (Signed)
Cardiology Consultation Note    Patient ID: James Mcbride Ellett, MRN: [id number], DOB/AGE: 1955/09/19 63 y.o. Admit date: 11/18/2018   Date of Consult: 11/18/2018 Primary Physician: Arlyss QueenSelvidge, William M, MD Primary Cardiologist: Dr. Juliann Paresallwood  Chief Complaint: frequent falls/elbow pain Reason for Consultation: chf Requesting MD: Dr. Nancy MarusMayo  HPI: James Mcbride Prazak is a 63 y.o. male with history of hypertension, cva and seizures presenting to er with instability while walking and frequent falls recently. He also state his legs have felt heavy makinig it hard to walk He also has left elbow and left hip pain.He reports some mild sob. CXR revealed persistent pulmonary vascular congestion without over pulmonary edema. EKG showed sinus bradycardia with no ischemia. He has no complaints of chest pain. No evidence of dvt on lower extremity ultrasound.  Patient is a very difficult historian.  He appears to have chronic morbid obesity.  His BMI is 58.23 kg/m.  Past Medical History:  Diagnosis Date  . Hypertension   . Seizures (HCC)   . Stroke (HCC) 2016  . Swelling       Surgical History: History reviewed. No pertinent surgical history.   Home Meds: Prior to Admission medications   Medication Sig Start Date End Date Taking? Authorizing Provider  acetaminophen (TYLENOL) 325 MG tablet Take 650 mg by mouth every 4 (four) hours as needed for mild pain, fever or headache.   Yes [provider]  amLODipine (NORVASC) 10 MG tablet Take 10 mg by mouth daily.   Yes [provider]  aspirin 81 MG tablet Take 81 mg by mouth daily.   Yes [provider]  atorvastatin (LIPITOR) 10 MG tablet Take 10 mg by mouth daily.   Yes [provider]  carvedilol (COREG) 25 MG tablet Take 25 mg by mouth 2 (two) times daily with a meal.   Yes [provider]  doxazosin (CARDURA) 8 MG tablet Take 8 mg by mouth at bedtime.    Yes [provider]  fluticasone (FLONASE) 50  MCG/ACT nasal spray Place 1 spray into both nostrils 2 (two) times daily. 07/27/18  Yes Tommi RumpsSummers, Rhonda L, PA-C  furosemide (LASIX) 40 MG tablet Take 1 tablet (40 mg total) by mouth daily. 07/18/16 11/18/18 Yes Emily FilbertWilliams, Jonathan E, MD  hydrALAZINE (APRESOLINE) 25 MG tablet Take 1 tablet (25 mg total) by mouth 3 (three) times daily. Patient taking differently: Take 100 mg by mouth 3 (three) times daily.  10/28/14  Yes Adrian SaranMody, Sital, MD  isosorbide mononitrate (IMDUR) 30 MG 24 hr tablet Take 30 mg by mouth daily.   Yes [provider]  labetalol (NORMODYNE) 100 MG tablet Take 100 mg by mouth 2 (two) times daily.   Yes [provider]  levETIRAcetam (KEPPRA) 750 MG tablet Take 750 mg by mouth 2 (two) times daily.   Yes [provider]  miconazole (MICOTIN) 2 % cream Apply 1 application topically 3 (three) times daily. Apply lightly to lower abdomen   Yes [provider]  PHENobarbital (LUMINAL) 97.2 MG tablet Take 97.2 mg by mouth at bedtime.    Yes [provider]  senna (SENOKOT) 8.6 MG TABS tablet Take 1 tablet by mouth at bedtime.    Yes [provider]  sodium chloride (OCEAN) 0.65 % SOLN nasal spray Place 2 sprays into both nostrils as needed for congestion. 07/27/18  Yes Tommi RumpsSummers, Rhonda L, PA-C    Inpatient Medications:     Allergies:  Allergies  Allergen Reactions  . Vasotec [Enalaprilat]  Swelling     Social History   Socioeconomic History  . Marital status: Single    Spouse name: Not on file  . Number of children: Not on file  . Years of education: Not on file  . Highest education level: Not on file  Occupational History  . Not on file  Social Needs  . Financial resource strain: Not on file  . Food insecurity:    Worry: Not on file    Inability: Not on file  . Transportation needs:    Medical: Not on file    Non-medical: Not on file  Tobacco Use  . Smoking status: Never Smoker  . Smokeless tobacco: Never Used   Substance and Sexual Activity  . Alcohol use: No  . Drug use: No  . Sexual activity: Not on file  Lifestyle  . Physical activity:    Days per week: Not on file    Minutes per session: Not on file  . Stress: Not on file  Relationships  . Social connections:    Talks on phone: Not on file    Gets together: Not on file    Attends religious service: Not on file    Active member of club or organization: Not on file    Attends meetings of clubs or organizations: Not on file    Relationship status: Not on file  . Intimate partner violence:    Fear of current or ex partner: Not on file    Emotionally abused: Not on file    Physically abused: Not on file    Forced sexual activity: Not on file  Other Topics Concern  . Not on file  Social History Narrative  . Not on file     Family History  Problem Relation Age of Onset  . Kidney disease Mother   . Diabetes Mother   . Cancer Other      Review of Systems: A 12-system review of systems was performed and is negative except as noted in the HPI.  Labs: No results for input(s): CKTOTAL, CKMB, TROPONINI in the last 72 hours. Lab Results  Component Value Date   WBC 5.4 11/18/2018   HGB 10.1 (L) 11/18/2018   HCT 31.8 (L) 11/18/2018   MCV 83.9 11/18/2018   PLT 130 (L) 11/18/2018    Recent Labs  Lab 11/18/18 0837  NA 138  K 4.0  CL 105  CO2 26  BUN 18  CREATININE 0.89  CALCIUM 10.6*  PROT 6.8  BILITOT 0.4  ALKPHOS 112  ALT 16  AST 18  GLUCOSE 127*   Lab Results  Component Value Date   CHOL 156 10/27/2014   HDL 38 (L) 10/27/2014   LDLCALC 99 10/27/2014   TRIG 93 10/27/2014   No results found for: DDIMER  Radiology/Studies:  Dg Pelvis 1-2 Views  Result Date: 11/18/2018 CLINICAL DATA:  Pelvic pain. EXAM: PELVIS - 1-2 VIEW COMPARISON:  None. FINDINGS: There is no evidence of pelvic fracture or diastasis. No pelvic bone lesions are seen. IMPRESSION: Negative. Electronically Signed   By: Lupita Raider M.D.   On:  11/18/2018 09:36   Dg Elbow 2 Views Left  Result Date: 11/18/2018 CLINICAL DATA:  Status post closed reduction of elbow dislocation. EXAM: LEFT ELBOW - 1 VIEW COMPARISON:  11/18/2018 FINDINGS: A single lateral radiograph demonstrates interval reduction of the previously seen dislocations of the radius and ulna. Olecranon spurring is noted. IMPRESSION: Interval reduction of elbow dislocation. Electronically Signed   By: Freida Busman  Mosetta Putt M.D.   On: 11/18/2018 11:28   Dg Elbow Complete Left  Result Date: 11/18/2018 CLINICAL DATA:  Left elbow pain after fall five days ago. EXAM: LEFT ELBOW - COMPLETE 3+ VIEW COMPARISON:  Radiographs of Nov 13, 2018. FINDINGS: There is posterior dislocation of the proximal radius and ulna. Mild olecranon spurring is noted. Several well-corticated bone densities are again noted. No definite fracture is noted. IMPRESSION: Posterior dislocation of proximal radius and ulna relative to distal left humerus. No definite fracture is noted. Electronically Signed   By: Lupita Raider M.D.   On: 11/18/2018 09:31   Dg Elbow Complete Left  Result Date: 11/13/2018 CLINICAL DATA:  Recent slip and fall with left elbow pain, initial encounter EXAM: LEFT ELBOW - COMPLETE 3+ VIEW COMPARISON:  None. FINDINGS: Olecranon spurring is noted. Small bony densities are noted adjacent to the articulation of the humerus with both the radius and ulna medially and laterally. No definitive donor sites are seen in these may be chronic in nature. Generalized soft tissue swelling is noted about elbow joint. Olecranon spur is noted. Elevation of the fat pads is noted consistent with acute joint effusion. IMPRESSION: Soft tissue changes as described. Bony densities adjacent to the elbow joint which appear well corticated and no donor sites are seen to suggest acute fracture. Nonemergent MRI may be helpful if symptomatology persists. Electronically Signed   By: Alcide Clever M.D.   On: 11/13/2018 22:10   Dg Wrist  Complete Left  Result Date: 11/18/2018 CLINICAL DATA:  Left wrist pain after fall 5 days ago EXAM: LEFT WRIST - COMPLETE 3+ VIEW COMPARISON:  None. FINDINGS: There is no evidence of fracture or dislocation. There is no evidence of arthropathy or other focal bone abnormality. Soft tissues are unremarkable. IMPRESSION: Negative. Electronically Signed   By: Lupita Raider M.D.   On: 11/18/2018 09:33   US Venous Img Lower Unilateral Left  Result Date: 11/18/2018 CLINICAL DATA:  Left lower extremity pain and edema after suffering a fall this Sunday. Evaluate for DVT. EXAM: LEFT LOWER EXTREMITY VENOUS DOPPLER ULTRASOUND TECHNIQUE: Gray-scale sonography with graded compression, as well as color Doppler and duplex ultrasound were performed to evaluate the lower extremity deep venous systems from the level of the common femoral vein and including the common femoral, femoral, profunda femoral, popliteal and calf veins including the posterior tibial, peroneal and gastrocnemius veins when visible. The superficial great saphenous vein was also interrogated. Spectral Doppler was utilized to evaluate flow at rest and with distal augmentation maneuvers in the common femoral, femoral and popliteal veins. COMPARISON:  None. FINDINGS: Examination is degraded due to patient body habitus. Contralateral Common Femoral Vein: Respiratory phasicity is normal and symmetric with the symptomatic side. No evidence of thrombus. Normal compressibility. Common Femoral Vein: No evidence of thrombus. Normal compressibility, respiratory phasicity and response to augmentation. Saphenofemoral Junction: No evidence of thrombus. Normal compressibility and flow on color Doppler imaging. Profunda Femoral Vein: No evidence of thrombus. Normal compressibility and flow on color Doppler imaging. Femoral Vein: No evidence of thrombus. Normal compressibility, respiratory phasicity and response to augmentation. Popliteal Vein: No evidence of thrombus.  Normal compressibility, respiratory phasicity and response to augmentation. Calf Veins: Not well visualized. Superficial Great Saphenous Vein: No evidence of thrombus. Normal compressibility. Venous Reflux:  None. Other Findings: There is a minimal amount of subcutaneous edema at the level of the left calf (images 34 and 35). IMPRESSION: No evidence of DVT within the left lower extremity. Electronically Signed  By: Simonne Come M.D.   On: 11/18/2018 11:15   Dg Chest Portable 1 View  Result Date: 11/18/2018 CLINICAL DATA:  Decreased oxygen saturation.  Hypertension. EXAM: PORTABLE CHEST 1 VIEW COMPARISON:  October 12, 2017 FINDINGS: There is cardiomegaly with pulmonary venous hypertension. There is no appreciable interstitial edema. There is no airspace consolidation or pleural effusion appreciable. No adenopathy. No bone lesions. IMPRESSION: Persistent pulmonary vascular congestion without overt edema or consolidation. No adenopathy evident. Electronically Signed   By: Bretta Bang III M.D.   On: 11/18/2018 10:25   Dg Femur Min 2 Views Left  Result Date: 11/18/2018 CLINICAL DATA:  Left leg pain after fall 5 days ago. EXAM: LEFT FEMUR 2 VIEWS COMPARISON:  None. FINDINGS: There is no evidence of fracture or other focal bone lesions. Soft tissues are unremarkable. IMPRESSION: Negative. Electronically Signed   By: Lupita Raider M.D.   On: 11/18/2018 09:34    Wt Readings from Last 3 Encounters:  11/18/18 (!) 172.4 kg  10/12/17 (!) 165.6 kg  07/18/16 (!) 159.7 kg    EKG: Sinus rhythm with no ischemia  Physical Exam: Morbidly obese African-American male in no acute distress Blood pressure (!) 118/52, pulse (!) 54, temperature 98.3 F (36.8 C), temperature source Oral, resp. rate 16, weight (!) 172.4 kg, SpO2 99 %. Body mass index is 59.53 kg/m. General: Morbidly obese, in no acute distress. Head: Normocephalic, atraumatic, sclera non-icteric, no xanthomas, nares are without discharge.  Neck:  Negative for carotid bruits. JVD not elevated. Lungs: Clear bilaterally to auscultation without wheezes, rales, or rhonchi. Breathing is unlabored. Heart: RRR with S1 S2.  Heart sounds difficult to evaluate secondary to obesity. Abdomen: Morbidly obese. Msk:  Strength and tone appear normal for age. Extremities: No clubbing or cyanosis. No edema.  Distal pedal pulses are 2+ and equal bilaterally. Neuro: Alert and oriented X 3. No facial asymmetry. No focal deficit. Moves all extremities spontaneously. Psych:  Responds to questions appropriately with a normal affect.     Assessment and Plan  Patient with history of preserved LV function admitted after falling injuring his left elbow.  He states it was difficult to ambulate to "heavy legs".  He is morbidly obese.  Chest x-ray did not show significant pulmonary edema.  BNP is trivially elevated.  EKG shows no ischemia.  Would agree with careful diuresis continue with current medications.  Echocardiogram done in 2016 showed normal LV function.  Will review repeat echo when available.  Continue with carvedilol, amlodipine, aspirin, atorvastatin, hydralazine and labetalol Signed, Dalia Heading MD 11/18/2018, 1:03 PM Pager: [phone]

## 2018-11-18 NOTE — Op Note (Signed)
Preprocedure diagnosis is posterior dislocation left elbow Post procedure diagnosis is same Procedures closed reduction left elbow dislocation Anesthesia is none Surgeon Rosita Kea Description: Patient was noted to have a posterior dislocation after having a normal x-ray 5 days ago after a fall.  It is unknown when this elbow dislocated but he came in with a very edematous arm and I was consulted by Dr. Peterson Ao in the emergency room to address this.  He with the patient's edema and hypoxia he did not feel comfortable with sedation so the procedure was done without sedation.  I informed patient of the procedure and the need to reduce this elbow as soon as possible to prevent further damage.  With the arm flexed at 90 degrees at the elbow longitudinal traction followed by dorsal pressure to the olecranon which was somewhat difficult secondary to approximately an inch of edema between the bone and skin a palpable reduction was felt and then postreduction x-ray confirmed concentric reduction of the elbow.  Dr. Darnelle Catalan then put a splint on the elbow and I will continue to follow him.

## 2018-11-18 NOTE — ED Notes (Signed)
AAOx3.  Skin warm and dry.  NAD 

## 2018-11-18 NOTE — ED Notes (Signed)
ED TO INPATIENT HANDOFF REPORT  ED Nurse Name and Phone #: Erskine SquibbJane [phone]  S Name/Age/Gender James Mcbride 63 y.o. male Room/Bed: ED08A/ED08A  Code Status   Code Status: Prior  Home/SNF/Other Skilled nursing facility Patient oriented to: self, place, time and situation Is this baseline? Yes   Triage Complete: Triage complete  Chief Complaint arm and leg pain ems  Triage Note [facility] of continued left sided pain since fall on Sunday.  Patient was seen through ED after fall, but EMS reports patient has had increased swelling and pain to left side.   Allergies Allergies  Allergen Reactions  . Vasotec [Enalaprilat]     Swelling     Level of Care/Admitting Diagnosis ED Disposition    ED Disposition Condition Comment   Admit  [facility]ospital [facility]: [facility] [[id number]]  Level of Care: Telemetry [5]  Covid Evaluation: Confirmed COVID Negative  Diagnosis: Acute on chronic diastolic (congestive) heart failure [facility](HCC) [[id number]]) [[id number]]  Admitting Physician: Willadean CarolMAYO, KATY DODD [[id number]]  Attending Physician: Willadean CarolMAYO, KATY DODD [[id number]]  Estimated length of stay: past midnight tomorrow  Certification:: I certify this patient will need inpatient services for at least 2 midnights  PT Class (Do Not Modify): Inpatient [101]  PT Acc Code (Do Not Modify): Private [1]       B Medical/Surgery History Past Medical History:  Diagnosis Date  . Hypertension   . Seizures (HCC)   . Stroke (HCC) 2016  . Swelling    History reviewed. No pertinent surgical history.   A IV Location/Drains/Wounds Patient Lines/Drains/Airways Status   Active Line/Drains/Airways    Name:   Placement date:   Placement time:   Site:   Days:   Peripheral IV 11/18/18 Right Forearm   11/18/18    0837    Forearm   less than 1   External Urinary Catheter   11/18/18    1228    -   less than 1          Intake/Output Last 24 hours No intake or output data in the 24 hours  ending 11/18/18 1238  Labs/Imaging Results for orders placed or performed during the hospital encounter of 11/18/18 (from the past 48 hour(s))  Comprehensive metabolic panel     Status: Abnormal   Collection Time: 11/18/18  8:37 AM  Result Value Ref Range   Sodium 138 135 - 145 mmol/L   Potassium 4.0 3.5 - 5.1 mmol/L   Chloride 105 98 - 111 mmol/L   CO2 26 22 - 32 mmol/L   Glucose, Bld 127 (H) 70 - 99 mg/dL   BUN 18 8 - 23 mg/dL   Creatinine, Ser 2.950.89 0.61 - 1.24 mg/dL   Calcium 28.410.6 (H) 8.9 - 10.3 mg/dL   Total Protein 6.8 6.5 - 8.1 g/dL   Albumin 3.6 3.5 - 5.0 g/dL   AST 18 15 - 41 U/L   ALT 16 0 - 44 U/L   Alkaline Phosphatase 112 38 - 126 U/L   Total Bilirubin 0.4 0.3 - 1.2 mg/dL   GFR calc non Af Amer >60 >60 mL/min   GFR calc Af Amer >60 >60 mL/min   Anion gap 7 5 - 15    Comment: Performed at [facility], [address] [place], [place] [address]  Brain natriuretic peptide     Status: Abnormal   Collection Time: 11/18/18  8:37 AM  Result Value Ref Range   B Natriuretic Peptide 205.0 (H) 0.0 -  100.0 pg/mL    Comment: Performed at [facility], [address] [place], [place] [address]  CBC with Differential     Status: Abnormal   Collection Time: 11/18/18  8:37 AM  Result Value Ref Range   WBC 5.4 4.0 - 10.5 K/uL   RBC 3.79 (L) 4.22 - 5.81 MIL/uL   Hemoglobin 10.1 (L) 13.0 - 17.0 g/dL   HCT 85.9 (L) 29.2 - 44.6 %   MCV 83.9 80.0 - 100.0 fL   MCH 26.6 26.0 - 34.0 pg   MCHC 31.8 30.0 - 36.0 g/dL   RDW 28.6 38.1 - 77.1 %   Platelets 130 (L) 150 - 400 K/uL   nRBC 0.0 0.0 - 0.2 %   Neutrophils Relative % 55 %   Neutro Abs 3.0 1.7 - 7.7 K/uL   Lymphocytes Relative 34 %   Lymphs Abs 1.8 0.7 - 4.0 K/uL   Monocytes Relative 9 %   Monocytes Absolute 0.5 0.1 - 1.0 K/uL   Eosinophils Relative 1 %   Eosinophils Absolute 0.1 0.0 - 0.5 K/uL   Basophils Relative 1 %   Basophils Absolute 0.0 0.0 - 0.1 K/uL   Immature Granulocytes 0 %   Abs Immature  Granulocytes 0.02 0.00 - 0.07 K/uL    Comment: Performed at [facility], [address], [place], [place] [address]   Dg Pelvis 1-2 Views  Result Date: 11/18/2018 CLINICAL DATA:  Pelvic pain. EXAM: PELVIS - 1-2 VIEW COMPARISON:  None. FINDINGS: There is no evidence of pelvic fracture or diastasis. No pelvic bone lesions are seen. IMPRESSION: Negative. Electronically Signed   By: Lupita Raider M.D.   On: 11/18/2018 09:36   Dg Elbow 2 Views Left  Result Date: 11/18/2018 CLINICAL DATA:  Status post closed reduction of elbow dislocation. EXAM: LEFT ELBOW - 1 VIEW COMPARISON:  11/18/2018 FINDINGS: A single lateral radiograph demonstrates interval reduction of the previously seen dislocations of the radius and ulna. Olecranon spurring is noted. IMPRESSION: Interval reduction of elbow dislocation. Electronically Signed   By: Sebastian Ache M.D.   On: 11/18/2018 11:28   Dg Elbow Complete Left  Result Date: 11/18/2018 CLINICAL DATA:  Left elbow pain after fall five days ago. EXAM: LEFT ELBOW - COMPLETE 3+ VIEW COMPARISON:  Radiographs of Nov 13, 2018. FINDINGS: There is posterior dislocation of the proximal radius and ulna. Mild olecranon spurring is noted. Several well-corticated bone densities are again noted. No definite fracture is noted. IMPRESSION: Posterior dislocation of proximal radius and ulna relative to distal left humerus. No definite fracture is noted. Electronically Signed   By: Lupita Raider M.D.   On: 11/18/2018 09:31   Dg Wrist Complete Left  Result Date: 11/18/2018 CLINICAL DATA:  Left wrist pain after fall 5 days ago EXAM: LEFT WRIST - COMPLETE 3+ VIEW COMPARISON:  None. FINDINGS: There is no evidence of fracture or dislocation. There is no evidence of arthropathy or other focal bone abnormality. Soft tissues are unremarkable. IMPRESSION: Negative. Electronically Signed   By: Lupita Raider M.D.   On: 11/18/2018 09:33   US Venous Img Lower Unilateral Left  Result Date:  11/18/2018 CLINICAL DATA:  Left lower extremity pain and edema after suffering a fall this Sunday. Evaluate for DVT. EXAM: LEFT LOWER EXTREMITY VENOUS DOPPLER ULTRASOUND TECHNIQUE: Gray-scale sonography with graded compression, as well as color Doppler and duplex ultrasound were performed to evaluate the lower extremity deep venous systems from the level of the common femoral vein  and including the common femoral, femoral, profunda femoral, popliteal and calf veins including the posterior tibial, peroneal and gastrocnemius veins when visible. The superficial great saphenous vein was also interrogated. Spectral Doppler was utilized to evaluate flow at rest and with distal augmentation maneuvers in the common femoral, femoral and popliteal veins. COMPARISON:  None. FINDINGS: Examination is degraded due to patient body habitus. Contralateral Common Femoral Vein: Respiratory phasicity is normal and symmetric with the symptomatic side. No evidence of thrombus. Normal compressibility. Common Femoral Vein: No evidence of thrombus. Normal compressibility, respiratory phasicity and response to augmentation. Saphenofemoral Junction: No evidence of thrombus. Normal compressibility and flow on color Doppler imaging. Profunda Femoral Vein: No evidence of thrombus. Normal compressibility and flow on color Doppler imaging. Femoral Vein: No evidence of thrombus. Normal compressibility, respiratory phasicity and response to augmentation. Popliteal Vein: No evidence of thrombus. Normal compressibility, respiratory phasicity and response to augmentation. Calf Veins: Not well visualized. Superficial Great Saphenous Vein: No evidence of thrombus. Normal compressibility. Venous Reflux:  None. Other Findings: There is a minimal amount of subcutaneous edema at the level of the left calf (images 34 and 35). IMPRESSION: No evidence of DVT within the left lower extremity. Electronically Signed   By: Simonne Come M.D.   On: 11/18/2018 11:15    Dg Chest Portable 1 View  Result Date: 11/18/2018 CLINICAL DATA:  Decreased oxygen saturation.  Hypertension. EXAM: PORTABLE CHEST 1 VIEW COMPARISON:  October 12, 2017 FINDINGS: There is cardiomegaly with pulmonary venous hypertension. There is no appreciable interstitial edema. There is no airspace consolidation or pleural effusion appreciable. No adenopathy. No bone lesions. IMPRESSION: Persistent pulmonary vascular congestion without overt edema or consolidation. No adenopathy evident. Electronically Signed   By: Bretta Bang III M.D.   On: 11/18/2018 10:25   Dg Femur Min 2 Views Left  Result Date: 11/18/2018 CLINICAL DATA:  Left leg pain after fall 5 days ago. EXAM: LEFT FEMUR 2 VIEWS COMPARISON:  None. FINDINGS: There is no evidence of fracture or other focal bone lesions. Soft tissues are unremarkable. IMPRESSION: Negative. Electronically Signed   By: Lupita Raider M.D.   On: 11/18/2018 09:34    Pending Labs Unresulted Labs (From admission, onward)    Start     Ordered   11/18/18 1159  SARS Coronavirus 2 (CEPHEID - Performed in [facility] hospital lab), Hosp Order  (Asymptomatic Patients Labs)  ONCE - STAT,   STAT    Question:  Rule Out  Answer:  Yes   11/18/18 1158   Signed and Held  HIV antibody (Routine Testing)  Once,   R     Signed and Held   Signed and Held  Basic metabolic panel  Daily,   R     Signed and Held   Signed and Held  Ferritin  Tomorrow morning,   R     Signed and Held   Signed and Held  Iron and TIBC  Tomorrow morning,   R     Signed and Held   Signed and Held  Vitamin B12  Tomorrow morning,   R     Signed and Held   Signed and Held  Folate  Tomorrow morning,   R     Signed and Held   Signed and Held  Hemoglobin A1c  Tomorrow morning,   R     Signed and Held   Signed and Held  CBC  Tomorrow morning,   R     Signed and Held  Vitals/Pain Today's Vitals   11/18/18 0833 11/18/18 0834 11/18/18 1221  BP:  (!) 136/52 (!) 118/52  Pulse:   (!)  54  Resp:  16 16  Temp:  98.3 F (36.8 C)   TempSrc:  Oral   SpO2:  95% 99%  Weight:  (!) 172.4 kg   PainSc: 6       Isolation Precautions No active isolations  Medications Medications  morphine 2 MG/ML injection 2 mg (2 mg Intravenous Given 11/18/18 1013)  ondansetron (ZOFRAN) injection 4 mg (4 mg Intravenous Given 11/18/18 1012)  furosemide (LASIX) injection 60 mg (60 mg Intravenous Given 11/18/18 1218)    Mobility walks with device Moderate fall risk   Focused Assessments .   R Recommendations: See Admitting Provider Note  Report given to:   Additional Notes: Patient had been ambulatory at SNF prior to fall on Sunday.  Since fall, patient has not been ambulatory, per EMS report.

## 2018-11-18 NOTE — H&P (Signed)
[employer] [facility] [place] [facility]   PATIENT NAME: James Mcbride    MR#:  [id number]  DATE OF BIRTH:  May 11, 1956  DATE OF ADMISSION:  11/18/2018  PRIMARY CARE PHYSICIAN: [name], MD   REQUESTING/REFERRING PHYSICIAN: Dorothea Glassman, MD  CHIEF COMPLAINT:   Chief Complaint  Patient presents with   Fall    HISTORY OF PRESENT ILLNESS:  James Mcbride  is a 63 y.o. male with a known history of hypertension, seizures, stroke who presented to the ED with inability to walk and worsening lower extremity edema over the last week.  Patient states that his legs have been heavy and this has caused him to have difficulty walking.  He has had a couple falls over the last couple of days.  He endorses some mild shortness of breath.  He denies any chest pain.  He endorses mild orthopnea.  He does not use any oxygen at home.  He does not use a walker at baseline.  He endorses left elbow and left hip pain.  In the ED, vitals were unremarkable.  Labs were significant for BNP 205, hemoglobin 10.1.  Left elbow x-ray showed a posterior dislocation of the proximal radius and ulna.  Patient's elbow was reduced by the orthopedic surgeon on-call (Dr. Rosita Kea).  Left hip and pelvic x-rays were negative.  Left lower extremity duplex ultrasound was negative for DVT.  Chest x-ray showed pulmonary vascular congestion.  Patient was given a dose of IV Lasix and hospitalists were called for admission. PAST MEDICAL HISTORY:   Past Medical History:  Diagnosis Date   Hypertension    Seizures (HCC)    Stroke (HCC) 2016   Swelling     PAST SURGICAL HISTORY:  History reviewed. No pertinent surgical history.  SOCIAL HISTORY:   Social History   Tobacco Use   Smoking status: Never Smoker   Smokeless tobacco: Never Used  Substance Use Topics   Alcohol use: No    FAMILY HISTORY:   Family History  Problem Relation Age of Onset   Kidney disease Mother    Diabetes Mother     Cancer Other     DRUG ALLERGIES:   Allergies  Allergen Reactions   Vasotec [Enalaprilat]     Swelling     REVIEW OF SYSTEMS:   Review of Systems  Constitutional: Negative for chills and fever.  HENT: Negative for congestion and sore throat.   Eyes: Negative for blurred vision and double vision.  Respiratory: Positive for shortness of breath. Negative for cough.   Cardiovascular: Positive for orthopnea and leg swelling. Negative for chest pain and palpitations.  Gastrointestinal: Negative for nausea and vomiting.  Genitourinary: Negative for dysuria and urgency.  Musculoskeletal: Positive for falls and joint pain.  Neurological: Negative for dizziness and headaches.  Psychiatric/Behavioral: Negative for depression. The patient is not nervous/anxious.     MEDICATIONS AT HOME:   Prior to Admission medications   Medication Sig Start Date End Date Taking? Authorizing Provider  acetaminophen (TYLENOL) 325 MG tablet Take 650 mg by mouth every 4 (four) hours as needed for mild pain, fever or headache.   Yes [provider]  amLODipine (NORVASC) 10 MG tablet Take 10 mg by mouth daily.   Yes [provider]  aspirin 81 MG tablet Take 81 mg by mouth daily.   Yes [provider]  atorvastatin (LIPITOR) 10 MG tablet Take 10 mg by mouth daily.   Yes [provider]  carvedilol (COREG) 25  MG tablet Take 25 mg by mouth 2 (two) times daily with a meal.   Yes [provider]  doxazosin (CARDURA) 8 MG tablet Take 8 mg by mouth at bedtime.    Yes [provider]  fluticasone (FLONASE) 50 MCG/ACT nasal spray Place 1 spray into both nostrils 2 (two) times daily. 07/27/18  Yes Tommi Rumps, PA-C  furosemide (LASIX) 40 MG tablet Take 1 tablet (40 mg total) by mouth daily. 07/18/16 11/18/18 Yes Emily Filbert, MD  hydrALAZINE (APRESOLINE) 25 MG tablet Take 1 tablet (25 mg total) by mouth 3 (three) times daily. Patient taking differently:  Take 100 mg by mouth 3 (three) times daily.  10/28/14  Yes Adrian Saran, MD  isosorbide mononitrate (IMDUR) 30 MG 24 hr tablet Take 30 mg by mouth daily.   Yes [provider]  labetalol (NORMODYNE) 100 MG tablet Take 100 mg by mouth 2 (two) times daily.   Yes [provider]  levETIRAcetam (KEPPRA) 750 MG tablet Take 750 mg by mouth 2 (two) times daily.   Yes [provider]  miconazole (MICOTIN) 2 % cream Apply 1 application topically 3 (three) times daily. Apply lightly to lower abdomen   Yes [provider]  PHENobarbital (LUMINAL) 97.2 MG tablet Take 97.2 mg by mouth at bedtime.    Yes [provider]  senna (SENOKOT) 8.6 MG TABS tablet Take 1 tablet by mouth at bedtime.    Yes [provider]  sodium chloride (OCEAN) 0.65 % SOLN nasal spray Place 2 sprays into both nostrils as needed for congestion. 07/27/18  Yes Bridget Hartshorn L, PA-C      VITAL SIGNS:  Blood pressure (!) 136/52, temperature 98.3 F (36.8 C), temperature source Oral, resp. rate 16, weight (!) 172.4 kg, SpO2 95 %.  PHYSICAL EXAMINATION:  Physical Exam  GENERAL:  63 y.o.-year-old patient lying in the bed with no acute distress.  EYES: Pupils equal, round, reactive to light and accommodation. No scleral icterus. Extraocular muscles intact.  HEENT: Head atraumatic, normocephalic. Oropharynx and nasopharynx clear.  NECK:  Supple, no jugular venous distention. No thyroid enlargement, no tenderness.  LUNGS: Normal breath sounds bilaterally, no wheezing, rales,rhonchi or crepitation. No use of accessory muscles of respiration.  CARDIOVASCULAR: RRR, S1, S2 normal. No murmurs, rubs, or gallops.  ABDOMEN: Soft, nontender, nondistended. Bowel sounds present. No organomegaly or mass.  EXTREMITIES: No cyanosis, or clubbing. +left elbow wrapped. 3+ bilateral pitting edema to the thighs. NEUROLOGIC: Cranial nerves II through XII are intact. Muscle strength 5/5 in all extremities.  Sensation intact. Gait not checked.  PSYCHIATRIC: The patient is alert and oriented x 3.  SKIN: No obvious rash, lesion, or ulcer.   LABORATORY PANEL:   CBC Recent Labs  Lab 11/18/18 0837  WBC 5.4  HGB 10.1*  HCT 31.8*  PLT 130*   ------------------------------------------------------------------------------------------------------------------  Chemistries  Recent Labs  Lab 11/18/18 0837  NA 138  K 4.0  CL 105  CO2 26  GLUCOSE 127*  BUN 18  CREATININE 0.89  CALCIUM 10.6*  AST 18  ALT 16  ALKPHOS 112  BILITOT 0.4   ------------------------------------------------------------------------------------------------------------------  Cardiac Enzymes No results for input(s): TROPONINI in the last 168 hours. ------------------------------------------------------------------------------------------------------------------  RADIOLOGY:  Dg Pelvis 1-2 Views  Result Date: 11/18/2018 CLINICAL DATA:  Pelvic pain. EXAM: PELVIS - 1-2 VIEW COMPARISON:  None. FINDINGS: There is no evidence of pelvic fracture or diastasis. No pelvic bone lesions are seen. IMPRESSION: Negative. Electronically Signed   By: Fayrene Fearing  Christen Butter M.D.   On: 11/18/2018 09:36   Dg Elbow 2 Views Left  Result Date: 11/18/2018 CLINICAL DATA:  Status post closed reduction of elbow dislocation. EXAM: LEFT ELBOW - 1 VIEW COMPARISON:  11/18/2018 FINDINGS: A single lateral radiograph demonstrates interval reduction of the previously seen dislocations of the radius and ulna. Olecranon spurring is noted. IMPRESSION: Interval reduction of elbow dislocation. Electronically Signed   By: Sebastian Ache M.D.   On: 11/18/2018 11:28   Dg Elbow Complete Left  Result Date: 11/18/2018 CLINICAL DATA:  Left elbow pain after fall five days ago. EXAM: LEFT ELBOW - COMPLETE 3+ VIEW COMPARISON:  Radiographs of Nov 13, 2018. FINDINGS: There is posterior dislocation of the proximal radius and ulna. Mild olecranon spurring is noted. Several  well-corticated bone densities are again noted. No definite fracture is noted. IMPRESSION: Posterior dislocation of proximal radius and ulna relative to distal left humerus. No definite fracture is noted. Electronically Signed   By: Lupita Raider M.D.   On: 11/18/2018 09:31   Dg Wrist Complete Left  Result Date: 11/18/2018 CLINICAL DATA:  Left wrist pain after fall 5 days ago EXAM: LEFT WRIST - COMPLETE 3+ VIEW COMPARISON:  None. FINDINGS: There is no evidence of fracture or dislocation. There is no evidence of arthropathy or other focal bone abnormality. Soft tissues are unremarkable. IMPRESSION: Negative. Electronically Signed   By: Lupita Raider M.D.   On: 11/18/2018 09:33   US Venous Img Lower Unilateral Left  Result Date: 11/18/2018 CLINICAL DATA:  Left lower extremity pain and edema after suffering a fall this Sunday. Evaluate for DVT. EXAM: LEFT LOWER EXTREMITY VENOUS DOPPLER ULTRASOUND TECHNIQUE: Gray-scale sonography with graded compression, as well as color Doppler and duplex ultrasound were performed to evaluate the lower extremity deep venous systems from the level of the common femoral vein and including the common femoral, femoral, profunda femoral, popliteal and calf veins including the posterior tibial, peroneal and gastrocnemius veins when visible. The superficial great saphenous vein was also interrogated. Spectral Doppler was utilized to evaluate flow at rest and with distal augmentation maneuvers in the common femoral, femoral and popliteal veins. COMPARISON:  None. FINDINGS: Examination is degraded due to patient body habitus. Contralateral Common Femoral Vein: Respiratory phasicity is normal and symmetric with the symptomatic side. No evidence of thrombus. Normal compressibility. Common Femoral Vein: No evidence of thrombus. Normal compressibility, respiratory phasicity and response to augmentation. Saphenofemoral Junction: No evidence of thrombus. Normal compressibility and flow on  color Doppler imaging. Profunda Femoral Vein: No evidence of thrombus. Normal compressibility and flow on color Doppler imaging. Femoral Vein: No evidence of thrombus. Normal compressibility, respiratory phasicity and response to augmentation. Popliteal Vein: No evidence of thrombus. Normal compressibility, respiratory phasicity and response to augmentation. Calf Veins: Not well visualized. Superficial Great Saphenous Vein: No evidence of thrombus. Normal compressibility. Venous Reflux:  None. Other Findings: There is a minimal amount of subcutaneous edema at the level of the left calf (images 34 and 35). IMPRESSION: No evidence of DVT within the left lower extremity. Electronically Signed   By: Simonne Come M.D.   On: 11/18/2018 11:15   Dg Chest Portable 1 View  Result Date: 11/18/2018 CLINICAL DATA:  Decreased oxygen saturation.  Hypertension. EXAM: PORTABLE CHEST 1 VIEW COMPARISON:  October 12, 2017 FINDINGS: There is cardiomegaly with pulmonary venous hypertension. There is no appreciable interstitial edema. There is no airspace consolidation or pleural effusion appreciable. No adenopathy. No bone lesions. IMPRESSION: Persistent pulmonary vascular  congestion without overt edema or consolidation. No adenopathy evident. Electronically Signed   By: Bretta BangWilliam  Woodruff III M.D.   On: 11/18/2018 10:25   Dg Femur Min 2 Views Left  Result Date: 11/18/2018 CLINICAL DATA:  Left leg pain after fall 5 days ago. EXAM: LEFT FEMUR 2 VIEWS COMPARISON:  None. FINDINGS: There is no evidence of fracture or other focal bone lesions. Soft tissues are unremarkable. IMPRESSION: Negative. Electronically Signed   By: Lupita RaiderJames  Green Jr M.D.   On: 11/18/2018 09:34      IMPRESSION AND PLAN:   Acute on chronic diastolic congestive heart failure- last echo 2016 with EF 55 to 60%.  Patient appears volume overloaded on exam. -Continue IV Lasix -Check ECHO -Strict I/O, daily weights -[facility]  Left elbow dislocation-  due to mechanical fall -Left elbow was reduced by Dr. Rosita KeaMenz and long-arm splint was placed -Needs to follow-up with Dr. Rosita KeaMenz as an outpatient  Hypertension-blood pressures mildly elevated in the ED -Continue home Norvasc, Doxazosin, Coreg, hydralazine, Imdur, labetalol  History of stroke- patient denies any residual deficits -Continue aspirin and Lipitor  History of seizures-no seizure-like activity recently -Continue home phenobarbital and Keppra  Normocytic anemia- no active bleeding -Check anemia panel  Hyperglycemia -Check A1c  OSA -Patient refuses CPAP  All the records are reviewed and case discussed with ED provider. Management plans discussed with the patient, family and they are in agreement.  CODE STATUS: Full  TOTAL TIME TAKING CARE OF THIS PATIENT: 45 minutes.    Jinny BlossomKaty D Sireen Halk M.D on 11/18/2018 at 11:56 AM  Between 7am to 6pm - Pager - [phone]  After 6pm go to [email or web address] - Social research officer, [email or web address] [email or web address]  [facility] [place] [facility]  Office  [phone]  CC: Primary care physician; Arlyss QueenSelvidge, William M, MD   Note: This dictation was prepared with Dragon dictation along with smaller phrase technology. Any transcriptional errors that result from this process are unintentional.

## 2018-11-18 NOTE — Progress Notes (Signed)
Family Meeting Note  Advance Directive:no  Today a meeting took place with the Patient.  Patient is able to participate.  The following clinical team members were present during this meeting:MD  The following were discussed:Patient's diagnosis: acute diastolic CHF, Patient's progosis: Unable to determine and Goals for treatment: Full Code  Additional follow-up to be provided: prn  Time spent during discussion:20 minutes  James Sinclair, MD

## 2018-11-18 NOTE — ED Provider Notes (Addendum)
[facility] Emergency Department Provider Note   ____________________________________________   First MD Initiated Contact with Patient 11/18/18 [phone]     (approximate)  I have reviewed the triage vital signs and the nursing notes.   HISTORY  Chief Complaint Fall    HPI James Mcbride is a 63 y.o. male who fell on Sunday.  He was seen in the emergency room then complaining of pain in the elbow and had an elbow x-ray that was read as negative.  He complains of increasing pain and swelling in the left elbow forearm and wrist and in the left hip with marked swelling of the left arm and leg.  Normally he can walk around by himself but he has not been able to do so since the fall.  Did stand and pivot today to get in the stretcher to go from the nursing home to hospital.        Past Medical History:  Diagnosis Date   Hypertension    Seizures (HCC)    Stroke (HCC) 2016   Swelling     Patient Active Problem List   Diagnosis Date Noted   TIA (transient ischemic attack) 10/27/2014    History reviewed. No pertinent surgical history.  Prior to Admission medications   Medication Sig Start Date End Date Taking? Authorizing Provider  acetaminophen (TYLENOL) 325 MG tablet Take 650 mg by mouth every 4 (four) hours as needed for mild pain, fever or headache.   Yes [provider]  amLODipine (NORVASC) 10 MG tablet Take 10 mg by mouth daily.   Yes [provider]  aspirin 81 MG tablet Take 81 mg by mouth daily.   Yes [provider]  atorvastatin (LIPITOR) 10 MG tablet Take 10 mg by mouth daily.   Yes [provider]  carvedilol (COREG) 25 MG tablet Take 25 mg by mouth 2 (two) times daily with a meal.   Yes [provider]  doxazosin (CARDURA) 8 MG tablet Take 8 mg by mouth at bedtime.    Yes [provider]  fluticasone (FLONASE) 50 MCG/ACT nasal spray Place 1 spray into both nostrils 2 (two) times  daily. 07/27/18  Yes Tommi RumpsSummers, Rhonda L, PA-C  furosemide (LASIX) 40 MG tablet Take 1 tablet (40 mg total) by mouth daily. 07/18/16 11/18/18 Yes Emily FilbertWilliams, Jonathan E, MD  hydrALAZINE (APRESOLINE) 25 MG tablet Take 1 tablet (25 mg total) by mouth 3 (three) times daily. Patient taking differently: Take 100 mg by mouth 3 (three) times daily.  10/28/14  Yes Adrian SaranMody, Sital, MD  isosorbide mononitrate (IMDUR) 30 MG 24 hr tablet Take 30 mg by mouth daily.   Yes [provider]  labetalol (NORMODYNE) 100 MG tablet Take 100 mg by mouth 2 (two) times daily.   Yes [provider]  levETIRAcetam (KEPPRA) 750 MG tablet Take 750 mg by mouth 2 (two) times daily.   Yes [provider]  miconazole (MICOTIN) 2 % cream Apply 1 application topically 3 (three) times daily. Apply lightly to lower abdomen   Yes [provider]  PHENobarbital (LUMINAL) 97.2 MG tablet Take 97.2 mg by mouth at bedtime.    Yes [provider]  senna (SENOKOT) 8.6 MG TABS tablet Take 1 tablet by mouth at bedtime.    Yes [provider]  sodium chloride (OCEAN) 0.65 % SOLN nasal spray Place 2 sprays into both nostrils as needed for congestion. 07/27/18  Yes Tommi RumpsSummers, Rhonda L, PA-C    Allergies Vasotec [  enalaprilat]  Family History  Problem Relation Age of Onset   Kidney disease Mother    Diabetes Mother    Cancer Other     Social History Social History   Tobacco Use   Smoking status: Never Smoker   Smokeless tobacco: Never Used  Substance Use Topics   Alcohol use: No   Drug use: No    Review of Systems } Constitutional: No fever/chills Eyes: No visual changes. ENT: No sore throat. Cardiovascular: Denies chest pain. Respiratory: Denies shortness of breath. Gastrointestinal: No abdominal pain.  No nausea, no vomiting.  No diarrhea.  No constipation. Genitourinary: Negative for dysuria. Musculoskeletal: Negative for back pain. Skin: Negative for rash. Neurological:  Negative for headaches, focal weakness   ____________________________________________   PHYSICAL EXAM:  VITAL SIGNS: ED Triage Vitals  Enc Vitals Group     BP 11/18/18 0834 (!) 136/52     Pulse --      Resp 11/18/18 0834 16     Temp 11/18/18 0834 98.3 F (36.8 C)     Temp Source 11/18/18 0834 Oral     SpO2 11/18/18 0834 95 %     Weight 11/18/18 0834 (!) 380 lb 1.2 oz (172.4 kg)     Height --      Head Circumference --      Peak Flow --      Pain Score 11/18/18 0833 6     Pain Loc --      Pain Edu? --      Excl. in GC? --     Constitutional: Alert and oriented. Well appearing and in no acute distress. Eyes: Conjunctivae are normal.  Head: Atraumatic. Nose: No congestion/rhinnorhea. Mouth/Throat: Mucous membranes are moist.  Oropharynx non-erythematous. Neck: No stridor.  Cardiovascular: Normal rate, regular rhythm. Grossly normal heart sounds.  Good peripheral circulation. Respiratory: Normal respiratory effort.  No retractions. Lungs CTAB. Gastrointestinal: Soft and nontender. No distention. No abdominal bruits. No CVA tenderness. Musculoskeletal: No lower extremity tenderness patient with bilateral edema much worse on the left than the right also his left arm is swollen..  Patient complains of a lot of pain in the left elbow and some in the hip as well.  Patient is able to stand and pivot but cannot walk as he normally does. Neurologic:  Normal speech and language. No gross focal neurologic deficits are appreciated. N Skin:  Skin is warm, dry and intact. No rash noted. Psychiatric: Mood and affect are normal. Speech and behavior are normal.  ____________________________________________   LABS (all labs ordered are listed, but only abnormal results are displayed)  Labs Reviewed  COMPREHENSIVE METABOLIC PANEL - Abnormal; Notable for the following components:      Result Value   Glucose, Bld 127 (*)    Calcium 10.6 (*)    All other components within normal limits    BRAIN NATRIURETIC PEPTIDE - Abnormal; Notable for the following components:   B Natriuretic Peptide 205.0 (*)    All other components within normal limits  CBC WITH DIFFERENTIAL/PLATELET - Abnormal; Notable for the following components:   RBC 3.79 (*)    Hemoglobin 10.1 (*)    HCT 31.8 (*)    Platelets 130 (*)    All other components within normal limits   ____________________________________________  EKG  EKG read and interpreted by me shows sinus bradycardia rate of 56 normal axis no acute ST-T wave changes ____________________________________________  RADIOLOGY  ED MD interpretation: Chest x-ray shows CHF  Official radiology report(s): Dg  Pelvis 1-2 Views  Result Date: 11/18/2018 CLINICAL DATA:  Pelvic pain. EXAM: PELVIS - 1-2 VIEW COMPARISON:  None. FINDINGS: There is no evidence of pelvic fracture or diastasis. No pelvic bone lesions are seen. IMPRESSION: Negative. Electronically Signed   By: Lupita Raider M.D.   On: 11/18/2018 09:36   Dg Elbow 2 Views Left  Result Date: 11/18/2018 CLINICAL DATA:  Status post closed reduction of elbow dislocation. EXAM: LEFT ELBOW - 1 VIEW COMPARISON:  11/18/2018 FINDINGS: A single lateral radiograph demonstrates interval reduction of the previously seen dislocations of the radius and ulna. Olecranon spurring is noted. IMPRESSION: Interval reduction of elbow dislocation. Electronically Signed   By: Sebastian Ache M.D.   On: 11/18/2018 11:28   Dg Elbow Complete Left  Result Date: 11/18/2018 CLINICAL DATA:  Left elbow pain after fall five days ago. EXAM: LEFT ELBOW - COMPLETE 3+ VIEW COMPARISON:  Radiographs of Nov 13, 2018. FINDINGS: There is posterior dislocation of the proximal radius and ulna. Mild olecranon spurring is noted. Several well-corticated bone densities are again noted. No definite fracture is noted. IMPRESSION: Posterior dislocation of proximal radius and ulna relative to distal left humerus. No definite fracture is noted.  Electronically Signed   By: Lupita Raider M.D.   On: 11/18/2018 09:31   Dg Wrist Complete Left  Result Date: 11/18/2018 CLINICAL DATA:  Left wrist pain after fall 5 days ago EXAM: LEFT WRIST - COMPLETE 3+ VIEW COMPARISON:  None. FINDINGS: There is no evidence of fracture or dislocation. There is no evidence of arthropathy or other focal bone abnormality. Soft tissues are unremarkable. IMPRESSION: Negative. Electronically Signed   By: Lupita Raider M.D.   On: 11/18/2018 09:33   US Venous Img Lower Unilateral Left  Result Date: 11/18/2018 CLINICAL DATA:  Left lower extremity pain and edema after suffering a fall this Sunday. Evaluate for DVT. EXAM: LEFT LOWER EXTREMITY VENOUS DOPPLER ULTRASOUND TECHNIQUE: Gray-scale sonography with graded compression, as well as color Doppler and duplex ultrasound were performed to evaluate the lower extremity deep venous systems from the level of the common femoral vein and including the common femoral, femoral, profunda femoral, popliteal and calf veins including the posterior tibial, peroneal and gastrocnemius veins when visible. The superficial great saphenous vein was also interrogated. Spectral Doppler was utilized to evaluate flow at rest and with distal augmentation maneuvers in the common femoral, femoral and popliteal veins. COMPARISON:  None. FINDINGS: Examination is degraded due to patient body habitus. Contralateral Common Femoral Vein: Respiratory phasicity is normal and symmetric with the symptomatic side. No evidence of thrombus. Normal compressibility. Common Femoral Vein: No evidence of thrombus. Normal compressibility, respiratory phasicity and response to augmentation. Saphenofemoral Junction: No evidence of thrombus. Normal compressibility and flow on color Doppler imaging. Profunda Femoral Vein: No evidence of thrombus. Normal compressibility and flow on color Doppler imaging. Femoral Vein: No evidence of thrombus. Normal compressibility, respiratory  phasicity and response to augmentation. Popliteal Vein: No evidence of thrombus. Normal compressibility, respiratory phasicity and response to augmentation. Calf Veins: Not well visualized. Superficial Great Saphenous Vein: No evidence of thrombus. Normal compressibility. Venous Reflux:  None. Other Findings: There is a minimal amount of subcutaneous edema at the level of the left calf (images 34 and 35). IMPRESSION: No evidence of DVT within the left lower extremity. Electronically Signed   By: Simonne Come M.D.   On: 11/18/2018 11:15   Dg Chest Portable 1 View  Result Date: 11/18/2018 CLINICAL DATA:  Decreased oxygen  saturation.  Hypertension. EXAM: PORTABLE CHEST 1 VIEW COMPARISON:  October 12, 2017 FINDINGS: There is cardiomegaly with pulmonary venous hypertension. There is no appreciable interstitial edema. There is no airspace consolidation or pleural effusion appreciable. No adenopathy. No bone lesions. IMPRESSION: Persistent pulmonary vascular congestion without overt edema or consolidation. No adenopathy evident. Electronically Signed   By: Bretta Bang III M.D.   On: 11/18/2018 10:25   Dg Femur Min 2 Views Left  Result Date: 11/18/2018 CLINICAL DATA:  Left leg pain after fall 5 days ago. EXAM: LEFT FEMUR 2 VIEWS COMPARISON:  None. FINDINGS: There is no evidence of fracture or other focal bone lesions. Soft tissues are unremarkable. IMPRESSION: Negative. Electronically Signed   By: Lupita Raider M.D.   On: 11/18/2018 09:34    ____________________________________________   PROCEDURES  Procedure(s) performed (including Critical Care): Nickel care time 45 minutes.  This includes evaluating the patient considering ways to try to reduce the patient's elbow and finally getting Dr. Rosita Kea to come in who reduced the elbow very well without sedation.  Also reviewing patient's old records and then discussing him with the hospitalist and getting him into the hospital for his congestive heart failure  and inability to walk.  Procedures   ____________________________________________   INITIAL IMPRESSION / ASSESSMENT AND PLAN / ED COURSE  Patient's O2 sats were 91-92.  He really cannot walk much at all.  We will get him in the hospital overnight see if we can diurese him this will ensure the safety of his elbow relocation that Dr. Rosita Kea did for me.  When sure he does not deteriorate either.              ____________________________________________   FINAL CLINICAL IMPRESSION(S) / ED DIAGNOSES  Final diagnoses:  Congestive heart failure, unspecified HF chronicity, unspecified heart failure type (HCC)  Hypoxia  Dislocation of left elbow, initial encounter     ED Discharge Orders    None       Note:  This document was prepared using Dragon voice recognition software and may include unintentional dictation errors.    Arnaldo Natal, MD 11/18/18 1157    Arnaldo Natal, MD 11/18/18 1157    Arnaldo Natal, MD 11/28/18 [phone]

## 2018-11-18 NOTE — ED Notes (Signed)
Return from XR.  AAOx3.  Skin warm and dry.  NAD

## 2018-11-18 NOTE — ED Triage Notes (Signed)
Arrives from the [facility] for evaluation of continued left sided pain since fall on Sunday.  Patient was seen through ED after fall, but EMS reports patient has had increased swelling and pain to left side.

## 2018-11-18 NOTE — ED Notes (Signed)
Placed on 2L [place]  

## 2018-11-18 NOTE — Progress Notes (Signed)
PHARMACIST - PHYSICIAN COMMUNICATION  CONCERNING:  Enoxaparin (Lovenox) for DVT Prophylaxis    RECOMMENDATION: Patient was prescribed enoxaprin 40mg  q24 hours for VTE prophylaxis.   Filed Weights   11/18/18 0834 11/18/18 1443  Weight: (!) 380 lb 1.2 oz (172.4 kg) (!) 383 lb (173.7 kg)    Body mass index is 58.23 kg/m.  Estimated Creatinine Clearance: 131.1 mL/min (by C-G formula based on SCr of 0.89 mg/dL).  Based on [facility] policy patient is candidate for enoxaparin 40mg  every 12 hour dosing due to BMI being >40.  DESCRIPTION: Pharmacy has adjusted enoxaparin dose per [facility] policy.  Patient is now receiving enoxaparin 40mg  every 12 hours.    Albina Billet, PharmD, BCPS Clinical Pharmacist 11/18/2018 2:58 PM;

## 2018-11-18 NOTE — ED Notes (Signed)
Patient transported to X-ray 

## 2018-11-19 ENCOUNTER — Inpatient Hospital Stay: Payer: Medicaid Other

## 2018-11-19 ENCOUNTER — Inpatient Hospital Stay
Admit: 2018-11-19 | Discharge: 2018-11-19 | Disposition: A | Discharge status: Home / Self Care | Payer: Medicaid Other | Attending: Internal Medicine | Admitting: Internal Medicine

## 2018-11-19 ENCOUNTER — Encounter: Payer: Self-pay | Admitting: Radiology

## 2018-11-19 LAB — BASIC METABOLIC PANEL
Anion gap: 5 (ref 5–15)
BUN: 24 mg/dL — ABNORMAL HIGH (ref 8–23)
CO2: 29 mmol/L (ref 22–32)
Calcium: 10.6 mg/dL — ABNORMAL HIGH (ref 8.9–10.3)
Chloride: 106 mmol/L (ref 98–111)
Creatinine, Ser: 1.07 mg/dL (ref 0.61–1.24)
GFR calc Af Amer: >60 mL/min (ref >60)
GFR calc non Af Amer: >60 mL/min (ref >60)
Glucose, Bld: 100 mg/dL — ABNORMAL HIGH (ref 70–99)
Potassium: 4.9 mmol/L (ref 3.5–5.1)
Sodium: 140 mmol/L (ref 135–145)

## 2018-11-19 LAB — CBC
HCT: 34.6 % — ABNORMAL LOW (ref 39.0–52.0)
Hemoglobin: 10.2 g/dL — ABNORMAL LOW (ref 13.0–17.0)
MCH: 26.4 pg (ref 26.0–34.0)
MCHC: 29.5 g/dL — ABNORMAL LOW (ref 30.0–36.0)
MCV: 89.4 fL (ref 80.0–100.0)
Platelets: 114 10*3/uL — ABNORMAL LOW (ref 150–400)
RBC: 3.87 MIL/uL — ABNORMAL LOW (ref 4.22–5.81)
RDW: 15.5 % (ref 11.5–15.5)
WBC: 4.6 10*3/uL (ref 4.0–10.5)
nRBC: 0 % (ref 0.0–0.2)

## 2018-11-19 LAB — IRON AND TIBC
Iron: 26 ug/dL — ABNORMAL LOW (ref 45–182)
Saturation Ratios: 9 % — ABNORMAL LOW (ref 17.9–39.5)
TIBC: 291 ug/dL (ref 250–450)
UIBC: 265 ug/dL

## 2018-11-19 LAB — BRAIN NATRIURETIC PEPTIDE: B Natriuretic Peptide: 307 pg/mL — ABNORMAL HIGH (ref 0.0–100.0)

## 2018-11-19 LAB — BLOOD GAS, ARTERIAL
Acid-Base Excess: 1.8 mmol/L (ref 0.0–2.0)
Acid-Base Excess: 2.3 mmol/L — ABNORMAL HIGH (ref 0.0–2.0)
Acid-Base Excess: 3.5 mmol/L — ABNORMAL HIGH (ref 0.0–2.0)
Bicarbonate: 32.4 mmol/L — ABNORMAL HIGH (ref 20.0–28.0)
Bicarbonate: 33.6 mmol/L — ABNORMAL HIGH (ref 20.0–28.0)
Bicarbonate: 32.6 mmol/L — ABNORMAL HIGH (ref 20.0–28.0)
Delivery systems: POSITIVE
Delivery systems: POSITIVE
Expiratory PAP: 8
Expiratory PAP: 8
FIO2: 0.28
FIO2: 0.6
FIO2: 0.3
Inspiratory PAP: 20
Inspiratory PAP: 20
O2 Saturation: 90.9 %
O2 Saturation: 96.9 %
O2 Saturation: 90.5 %
Patient temperature: 37
Patient temperature: 37
Patient temperature: 37
RATE: 12 resp/min
RATE: 18 resp/min
pCO2 arterial: 83 mmHg (ref 32.0–48.0)
pCO2 arterial: 90 mmHg (ref 32.0–48.0)
pCO2 arterial: 71 mmHg (ref 32.0–48.0)
pH, Arterial: 7.2 — ABNORMAL LOW (ref 7.350–7.450)
pH, Arterial: 7.18 — CL (ref 7.350–7.450)
pH, Arterial: 7.27 — ABNORMAL LOW (ref 7.350–7.450)
pO2, Arterial: 74 mmHg — ABNORMAL LOW (ref 83.0–108.0)
pO2, Arterial: 109 mmHg — ABNORMAL HIGH (ref 83.0–108.0)
pO2, Arterial: 68 mmHg — ABNORMAL LOW (ref 83.0–108.0)

## 2018-11-19 LAB — MRSA PCR SCREENING: MRSA by PCR: NEGATIVE

## 2018-11-19 LAB — GLUCOSE, CAPILLARY: Glucose-Capillary: 99 mg/dL (ref 70–99)

## 2018-11-19 LAB — HEMOGLOBIN A1C
Hgb A1c MFr Bld: 5.5 % (ref 4.8–5.6)
Mean Plasma Glucose: 111.15 mg/dL

## 2018-11-19 LAB — VITAMIN B12: Vitamin B-12: 202 pg/mL (ref 180–914)

## 2018-11-19 LAB — FOLATE: Folate: 9.4 ng/mL (ref >5.9)

## 2018-11-19 LAB — FERRITIN: Ferritin: 37 ng/mL (ref 24–336)

## 2018-11-19 MED ORDER — ALBUTEROL SULFATE (2.5 MG/3ML) 0.083% IN NEBU
2.5000 mg | INHALATION_SOLUTION | RESPIRATORY_TRACT | Status: DC
  Administered 2018-11-19 – 2018-11-20 (×3): 2.5 mg via RESPIRATORY_TRACT
  Filled 2018-11-19 (×3): qty 3

## 2018-11-19 MED ORDER — PHENOBARBITAL SODIUM 65 MG/ML IJ SOLN
65.0000 mg | Freq: Every day | INTRAMUSCULAR | Status: DC
  Administered 2018-11-19 – 2018-11-23 (×5): 65 mg via INTRAVENOUS
  Filled 2018-11-19 (×5): qty 1

## 2018-11-19 MED ORDER — SODIUM CHLORIDE 0.9 % IV SOLN
750.0000 mg | Freq: Two times a day (BID) | INTRAVENOUS | Status: DC
  Administered 2018-11-19 – 2018-11-24 (×10): 750 mg via INTRAVENOUS
  Filled 2018-11-19 (×2): qty 7.5
  Filled 2018-11-19: qty 50
  Filled 2018-11-19 (×11): qty 7.5

## 2018-11-19 MED ORDER — IOHEXOL 350 MG/ML SOLN
75.0000 mL | Freq: Once | INTRAVENOUS | Status: AC | PRN
  Administered 2018-11-19: 11:00:00 75 mL via INTRAVENOUS

## 2018-11-19 NOTE — Progress Notes (Signed)
Patient is seen for follow-up of his left elbow dislocation.  He still has a great deal of swelling in his hand and so an Ace wrap was applied around the area to try to get rid of that edema.  He appears comfortable around the elbow the splint was not changed.  My plan is to see him back next Friday for a follow-up x-ray and continued splinting for an additional week since we do not know how long the elbow is out it may be unstable and a hinged brace keeping from full extension may be required.

## 2018-11-19 NOTE — Progress Notes (Signed)
MD notified of blood gas results. I will continue to assess.

## 2018-11-19 NOTE — Consult Note (Signed)
CRITICAL CARE NOTE      CHIEF COMPLAINT:   Acute hypercapnic and hypoxic respiratory failure      This is a 63 year old male with morbid obesity, severe obstructive sleep apnea on CPAP at home with noncompliance as per family, history of stroke history of seizure disorder, came in after mechanical fall and trauma to elbow was evaluated by orthopedics with plans for follow-up next Friday and is status post splinting of left elbow.  Patient was noted to become progressively lethargic and poorly responsive with ABG showing significant hypercapnia.  Critical care consultation was placed for upgrading care to manage hypercapnic encephalopathy and acute on chronic respiratory failure with hypoxemia and hypercarbia.   -Had phone conference with multiple family members including sister [name] , and answered all questions and discussed care plan at length.  Specifically had explained the patient has been on BiPAP minimal improvement thus far and we are concerned and considering endotracheal intubation.  Family agrees and appreciates care.  PAST MEDICAL HISTORY   Past Medical History:  Diagnosis Date  . Hypertension   . Seizures (HCC)   . Stroke (HCC) 2016  . Swelling      SURGICAL HISTORY   History reviewed. No pertinent surgical history.   FAMILY HISTORY   Family History  Problem Relation Age of Onset  . Kidney disease Mother   . Diabetes Mother   . Cancer Other      SOCIAL HISTORY   Social History   Tobacco Use  . Smoking status: Never Smoker  . Smokeless tobacco: Never Used  Substance Use Topics  . Alcohol use: No  . Drug use: No     MEDICATIONS   Current Medication:  Current Facility-Administered Medications:  .  0.9 %  sodium chloride infusion, 250 mL, Intravenous, PRN, Mayo, Allyn Kenner,  MD .  acetaminophen (TYLENOL) tablet 650 mg, 650 mg, Oral, Q4H PRN, Mayo, Allyn Kenner, MD .  aspirin chewable tablet 81 mg, 81 mg, Oral, Daily, Mayo, Allyn Kenner, MD, 81 mg at 11/19/18 1039 .  atorvastatin (LIPITOR) tablet 10 mg, 10 mg, Oral, Daily, Mayo, Allyn Kenner, MD, 10 mg at 11/19/18 1039 .  enoxaparin (LOVENOX) injection 40 mg, 40 mg, Subcutaneous, Q12H, Albina Billet, RPH, 40 mg at 11/19/18 1039 .  fluticasone (FLONASE) 50 MCG/ACT nasal spray 1 spray, 1 spray, Each Nare, BID, Mayo, Allyn Kenner, MD, 1 spray at 11/19/18 [phone] .  furosemide (LASIX) injection 60 mg, 60 mg, Intravenous, BID, Mayo, Allyn Kenner, MD, 60 mg at 11/19/18 1802 .  levETIRAcetam (KEPPRA) tablet 750 mg, 750 mg, Oral, BID, Mayo, Allyn Kenner, MD, 750 mg at 11/19/18 1039 .  miconazole (MICOTIN) 2 % cream 1 application, 1 application, Topical, BID, Mayo, Allyn Kenner, MD, 1 application at 11/19/18 1043 .  ondansetron (ZOFRAN) injection 4 mg, 4 mg, Intravenous, Q6H PRN, Mayo, Allyn Kenner, MD .  PHENobarbital (LUMINAL) tablet 97.2 mg, 97.2 mg, Oral, QHS, Mayo, Allyn Kenner, MD, 97.2 mg at 11/18/18 2247 .  sodium chloride (OCEAN) 0.65 % nasal spray 2 spray, 2 spray, Each Nare, PRN, Mayo, Allyn Kenner, MD .  sodium chloride flush (NS) 0.9 % injection 3 mL, 3 mL, Intravenous, Q12H, Mayo, Allyn Kenner, MD, 3 mL at 11/19/18 0839 .  sodium chloride flush (NS) 0.9 % injection 3 mL, 3 mL, Intravenous, PRN, Mayo, Allyn Kenner, MD    ALLERGIES   Vasotec [enalaprilat]    REVIEW OF SYSTEMS   We will to obtain due to lethargy  PHYSICAL  EXAMINATION   Vitals:   11/19/18 1700 11/19/18 1800  BP: (!) 111/59   Pulse: (!) 48 (!) 45  Resp: 16 17  Temp:    SpO2: 98% 91%    GENERAL: Encephalopathic HEAD: Normocephalic, atraumatic.  EYES: Pupils equal, round, reactive to light.  No scleral icterus.  MOUTH: Moist mucosal membrane. NECK: Supple. No thyromegaly. No nodules. No JVD.  PULMONARY: Decreased breath sounds bilaterally with crackles at the  bases CARDIOVASCULAR: S1 and S2. Regular rate and rhythm. No murmurs, rubs, or gallops.  GASTROINTESTINAL: Soft, nontender, non-distended. No masses. Positive bowel sounds. No hepatosplenomegaly.  MUSCULOSKELETAL: No swelling, clubbing, or edema.  NEUROLOGIC: Mild distress due to acute illness SKIN:intact,warm,dry   LABS AND IMAGING     LAB RESULTS: Recent Labs  Lab 11/18/18 0837 11/19/18 0517  NA 138 140  K 4.0 4.9  CL 105 106  CO2 26 29  BUN 18 24*  CREATININE 0.89 1.07  GLUCOSE 127* 100*   Recent Labs  Lab 11/18/18 0837 11/19/18 0517  HGB 10.1* 10.2*  HCT 31.8* 34.6*  WBC 5.4 4.6  PLT 130* 114*     IMAGING RESULTS: Ct Angio Chest Pe W Or Wo Contrast  Result Date: 11/19/2018 CLINICAL DATA:  Morbid obesity.  Abnormal chest radiograph EXAM: CT ANGIOGRAPHY CHEST WITH CONTRAST TECHNIQUE: Multidetector CT imaging of the chest was performed using the standard protocol during bolus administration of intravenous contrast. Multiplanar CT image reconstructions and MIPs were obtained to evaluate the vascular anatomy. CONTRAST:  75mL OMNIPAQUE IOHEXOL 350 MG/ML SOLN COMPARISON:  Radiograph 11/18/2018 FINDINGS: Cardiovascular:  Heart is enlarged.  No pericardial fluid. Exam is limited by respiratory motion and body habitus. No filling defects evident within the pulmonary arteries identified. The distal lower lobe pulmonary arteries are difficult to evaluate. Mediastinum/Nodes: No axillary supraclavicular adenopathy. No mediastinal and Lungs/Pleura: There is bibasilar linear peribronchial thickening and atelectasis within the medial LEFT and RIGHT lower lobe. No air bronchograms. Upper lungs relatively clear. Upper Abdomen: No acute abnormality. Musculoskeletal: No chest wall abnormality. No acute or significant osseous findings. Review of the MIP images confirms the above findings. IMPRESSION: 1. Suboptimal exam due to patient respiratory motion and large body habitus. With this caveat,  no evidence of acute pulmonary embolism. 2. Severe medial bibasilar atelectasis. Potential superimposed pneumonia or aspiration pneumonitis. Electronically Signed   By: Genevive Bi M.D.   On: 11/19/2018 13:49      ASSESSMENT AND PLAN    -Multidisciplinary rounds held today  Acute Hypoxic and hypercarbic respiratory Failure - on BiPAP -Changed pressures and discussed with respiratory therapist -ABG with severe hypercapnia -Continues to worsen may need endotracheal intubation -continue Full MV support -continue Bronchodilator Therapy -Possibly due to obesity related asthma/obesity hypoventilation syndrome/ severe obstructive sleep apnea/diastolic heart failure   Chronic diastolic CHF -Cardiology consultation appreciate input -oxygen as needed -Lasix as tolerated -follow up cardiac enzymes as indicated ICU monitoring    NEUROLOGY Allergic with hypercapnic encephalopathy Wake up assessment pending   ID -continue IV abx as prescibed -follow up cultures  GI/Nutrition GI PROPHYLAXIS as indicated DIET-->TF's as tolerated Constipation protocol as indicated  ENDO - ICU hypoglycemic\Hyperglycemia protocol -check FSBS per protocol   ELECTROLYTES -follow labs as needed -replace as needed -pharmacy consultation   DVT/GI PRX ordered -SCDs  TRANSFUSIONS AS NEEDED MONITOR FSBS ASSESS the need for LABS as needed   Critical care provider statement:    Critical care time (minutes):  32   Critical care time was exclusive of:  Separately billable procedures and treating other patients   Critical care was necessary to treat or prevent imminent or life-threatening deterioration of the following conditions:   Acute  hypoxemic and hypercarbic respiratory failure, systolic heart failure, morbid obesity, multiple comorbid conditions   Critical care was time spent personally by me on the following activities:  Development of treatment plan with patient or surrogate,  discussions with consultants, evaluation of patient's response to treatment, examination of patient, obtaining history from patient or surrogate, ordering and performing treatments and interventions, ordering and review of laboratory studies and re-evaluation of patient's condition.  I assumed direction of critical care for this patient from another provider in my specialty: no    This document was prepared using Dragon voice recognition software and may include unintentional dictation errors.    Vida RiggerFuad Anjalina Bergevin, M.D.  Division of Pulmonary & Critical Care Medicine  [facility]

## 2018-11-19 NOTE — Progress Notes (Signed)
*  PRELIMINARY RESULTS* Echocardiogram 2D Echocardiogram has been performed.  [name] 11/19/2018, 10:23 AM

## 2018-11-19 NOTE — Progress Notes (Signed)
Patient Name: James Mcbride Date of Encounter: 11/19/2018  Hospital Problem List     Active Problems:   Acute on chronic diastolic (congestive) heart failure [facility])    Patient Profile     63 year old male admitted with a fall causing dislocation of his left elbow.  Patient has severe obesity as well as profound anasarca.  Renal function appears stable with a GFR greater than 60.  BNP is minimally elevated at 205.  2 years ago was 64.  Chest x-ray reveals persistent pulmonary vascular congestion without overt pulmonary edema.  He is ruled out for myocardial infarction.  Echo in 2016 revealed preserved LV function.  Echo is pending during this hospitalization.  Subjective   Still short of breath  Inpatient Medications    . aspirin  81 mg Oral Daily  . atorvastatin  10 mg Oral Daily  . enoxaparin (LOVENOX) injection  40 mg Subcutaneous Q12H  . fluticasone  1 spray Each Nare BID  . furosemide  60 mg Intravenous BID  . levETIRAcetam  750 mg Oral BID  . miconazole  1 application Topical BID  . PHENobarbital  97.2 mg Oral QHS  . sodium chloride flush  3 mL Intravenous Q12H    Vital Signs    Vitals:   11/18/18 2317 11/19/18 0453 11/19/18 0530 11/19/18 0729  BP: (!) 101/50 (!) 120/48  (!) 101/51  Pulse: (!) 46 (!) 46  (!) 44  Resp:  20  18  Temp:    98.2 F (36.8 C)  TempSrc:      SpO2:  90%  94%  Weight:   (!) 173.4 kg   Height:        Intake/Output Summary (Last 24 hours) at 11/19/2018 1320 Last data filed at 11/19/2018 0531 Gross per 24 hour  Intake 240 ml  Output 750 ml  Net -510 ml   Filed Weights   11/18/18 0834 11/18/18 1443 11/19/18 0530  Weight: (!) 172.4 kg (!) 173.7 kg (!) 173.4 kg    Physical Exam    GEN: Well nourished, well developed, in no acute distress.  HEENT: normal.  Neck: Supple, no JVD, carotid bruits, or masses. Cardiac: RRR, no murmurs, rubs, or gallops. No clubbing, cyanosis, edema.  Radials/DP/PT 2+ and equal bilaterally.   Respiratory:  Respirations regular and unlabored, clear to auscultation bilaterally. GI: Soft, nontender, nondistended, BS + x 4. MS: no deformity or atrophy. Skin: warm and dry, no rash. Neuro:  Strength and sensation are intact. Psych: Normal affect.  Labs    CBC Recent Labs    11/18/18 0837 11/19/18 0517  WBC 5.4 4.6  NEUTROABS 3.0  --   HGB 10.1* 10.2*  HCT 31.8* 34.6*  MCV 83.9 89.4  PLT 130* 114*   Basic Metabolic Panel Recent Labs    17/61/60 0837 11/19/18 0517  NA 138 140  K 4.0 4.9  CL 105 106  CO2 26 29  GLUCOSE 127* 100*  BUN 18 24*  CREATININE 0.89 1.07  CALCIUM 10.6* 10.6*   Liver Function Tests Recent Labs    11/18/18 0837  AST 18  ALT 16  ALKPHOS 112  BILITOT 0.4  PROT 6.8  ALBUMIN 3.6   No results for input(s): LIPASE, AMYLASE in the last 72 hours. Cardiac Enzymes No results for input(s): CKTOTAL, CKMB, CKMBINDEX, TROPONINI in the last 72 hours. BNP Recent Labs    11/18/18 0837  BNP 205.0*   D-Dimer No results for input(s): DDIMER in the last 72 hours.  Hemoglobin A1C Recent Labs    11/19/18 0517  HGBA1C 5.5   Fasting Lipid Panel No results for input(s): CHOL, HDL, LDLCALC, TRIG, CHOLHDL, LDLDIRECT in the last 72 hours. Thyroid Function Tests No results for input(s): TSH, T4TOTAL, T3FREE, THYROIDAB in the last 72 hours.  Invalid input(s): FREET3  Telemetry    Normal sinus rhythm  ECG    Normal sinus rhythm with no ischemia  Radiology    Dg Pelvis 1-2 Views  Result Date: 11/18/2018 CLINICAL DATA:  Pelvic pain. EXAM: PELVIS - 1-2 VIEW COMPARISON:  None. FINDINGS: There is no evidence of pelvic fracture or diastasis. No pelvic bone lesions are seen. IMPRESSION: Negative. Electronically Signed   By: Lupita Raider M.D.   On: 11/18/2018 09:36   Dg Elbow 2 Views Left  Result Date: 11/18/2018 CLINICAL DATA:  Status post closed reduction of elbow dislocation. EXAM: LEFT ELBOW - 1 VIEW COMPARISON:  11/18/2018 FINDINGS: A  single lateral radiograph demonstrates interval reduction of the previously seen dislocations of the radius and ulna. Olecranon spurring is noted. IMPRESSION: Interval reduction of elbow dislocation. Electronically Signed   By: Sebastian Ache M.D.   On: 11/18/2018 11:28   Dg Elbow Complete Left  Result Date: 11/18/2018 CLINICAL DATA:  Left elbow pain after fall five days ago. EXAM: LEFT ELBOW - COMPLETE 3+ VIEW COMPARISON:  Radiographs of Nov 13, 2018. FINDINGS: There is posterior dislocation of the proximal radius and ulna. Mild olecranon spurring is noted. Several well-corticated bone densities are again noted. No definite fracture is noted. IMPRESSION: Posterior dislocation of proximal radius and ulna relative to distal left humerus. No definite fracture is noted. Electronically Signed   By: Lupita Raider M.D.   On: 11/18/2018 09:31   Dg Elbow Complete Left  Result Date: 11/13/2018 CLINICAL DATA:  Recent slip and fall with left elbow pain, initial encounter EXAM: LEFT ELBOW - COMPLETE 3+ VIEW COMPARISON:  None. FINDINGS: Olecranon spurring is noted. Small bony densities are noted adjacent to the articulation of the humerus with both the radius and ulna medially and laterally. No definitive donor sites are seen in these may be chronic in nature. Generalized soft tissue swelling is noted about elbow joint. Olecranon spur is noted. Elevation of the fat pads is noted consistent with acute joint effusion. IMPRESSION: Soft tissue changes as described. Bony densities adjacent to the elbow joint which appear well corticated and no donor sites are seen to suggest acute fracture. Nonemergent MRI may be helpful if symptomatology persists. Electronically Signed   By: Alcide Clever M.D.   On: 11/13/2018 22:10   Dg Wrist Complete Left  Result Date: 11/18/2018 CLINICAL DATA:  Left wrist pain after fall 5 days ago EXAM: LEFT WRIST - COMPLETE 3+ VIEW COMPARISON:  None. FINDINGS: There is no evidence of fracture or  dislocation. There is no evidence of arthropathy or other focal bone abnormality. Soft tissues are unremarkable. IMPRESSION: Negative. Electronically Signed   By: Lupita Raider M.D.   On: 11/18/2018 09:33   US Venous Img Lower Unilateral Left  Result Date: 11/18/2018 CLINICAL DATA:  Left lower extremity pain and edema after suffering a fall this Sunday. Evaluate for DVT. EXAM: LEFT LOWER EXTREMITY VENOUS DOPPLER ULTRASOUND TECHNIQUE: Gray-scale sonography with graded compression, as well as color Doppler and duplex ultrasound were performed to evaluate the lower extremity deep venous systems from the level of the common femoral vein and including the common femoral, femoral, profunda femoral, popliteal and calf veins including  the posterior tibial, peroneal and gastrocnemius veins when visible. The superficial great saphenous vein was also interrogated. Spectral Doppler was utilized to evaluate flow at rest and with distal augmentation maneuvers in the common femoral, femoral and popliteal veins. COMPARISON:  None. FINDINGS: Examination is degraded due to patient body habitus. Contralateral Common Femoral Vein: Respiratory phasicity is normal and symmetric with the symptomatic side. No evidence of thrombus. Normal compressibility. Common Femoral Vein: No evidence of thrombus. Normal compressibility, respiratory phasicity and response to augmentation. Saphenofemoral Junction: No evidence of thrombus. Normal compressibility and flow on color Doppler imaging. Profunda Femoral Vein: No evidence of thrombus. Normal compressibility and flow on color Doppler imaging. Femoral Vein: No evidence of thrombus. Normal compressibility, respiratory phasicity and response to augmentation. Popliteal Vein: No evidence of thrombus. Normal compressibility, respiratory phasicity and response to augmentation. Calf Veins: Not well visualized. Superficial Great Saphenous Vein: No evidence of thrombus. Normal compressibility. Venous  Reflux:  None. Other Findings: There is a minimal amount of subcutaneous edema at the level of the left calf (images 34 and 35). IMPRESSION: No evidence of DVT within the left lower extremity. Electronically Signed   By: Simonne ComeJohn  Watts M.D.   On: 11/18/2018 11:15   Dg Chest Portable 1 View  Result Date: 11/18/2018 CLINICAL DATA:  Decreased oxygen saturation.  Hypertension. EXAM: PORTABLE CHEST 1 VIEW COMPARISON:  October 12, 2017 FINDINGS: There is cardiomegaly with pulmonary venous hypertension. There is no appreciable interstitial edema. There is no airspace consolidation or pleural effusion appreciable. No adenopathy. No bone lesions. IMPRESSION: Persistent pulmonary vascular congestion without overt edema or consolidation. No adenopathy evident. Electronically Signed   By: Bretta BangWilliam  Woodruff III M.D.   On: 11/18/2018 10:25   Dg Femur Min 2 Views Left  Result Date: 11/18/2018 CLINICAL DATA:  Left leg pain after fall 5 days ago. EXAM: LEFT FEMUR 2 VIEWS COMPARISON:  None. FINDINGS: There is no evidence of fracture or other focal bone lesions. Soft tissues are unremarkable. IMPRESSION: Negative. Electronically Signed   By: Lupita RaiderJames  Green Jr M.D.   On: 11/18/2018 09:34    Assessment & Plan    63 year old male with morbid obesity and history of diastolic dysfunction admitted with increasing shortness of breath however presented with a fall.  He has ruled out for myocardial infarction.  Chest x-ray reveals no overt pulmonary edema.  He has diffuse pitting edema throughout his body.  Serum albumin appears normal.  Renal function appears unremarkable.  Electrocardiogram appears unremarkable.  Being seen by orthopedics for his elbow.  Has had hypoxia.  He may need bronchodilators.  We will follow with you for now.  No acute cardiac procedures indicated at present.  Signed, Darlin PriestlyKenneth A. Kyreese Chio MD 11/19/2018, 1:20 PM  Pager: [phone]

## 2018-11-19 NOTE — Progress Notes (Signed)
Pt HR sustaining at 45 on telemetry and BP of 104/45. MD made aware, labetalol and hydralazine held.

## 2018-11-19 NOTE — Progress Notes (Signed)
PT Cancellation Note  Patient Details Name: James Mcbride MRN: [id number] DOB: 1956/01/10   Cancelled Treatment:    Reason Eval/Treat Not Completed: Patient's level of consciousness;Fatigue/lethargy limiting ability to participate(Chaert reviewed, RN consulted. Attempted to see patient, but he is quite drowsy, in and out of sleep while in room. Speech is heavily slurred and patient is not responding to questions in a coherent way. WIll attempt again at later date/time once patient is able to participate.)   9:16 AM, 11/19/18 Rosamaria Lints, PT, DPT Physical Therapist - [facility]  [phone] (ASCOM)    Buccola,Allan C 11/19/2018, 9:16 AM

## 2018-11-19 NOTE — Progress Notes (Signed)
PT Cancellation Note  Patient Details Name: James Mcbride MRN: [id number] DOB: 04-Jun-1956   Cancelled Treatment:    Reason Eval/Treat Not Completed: Medical issues which prohibited therapy;Other (comment)(Pt has had a status change and moved to ICU today. I will complete his PT order. Please place new PT order once patient is medically ready. )   3:00 PM, 11/19/18 Rosamaria Lints, PT, DPT Physical Therapist - [facility]  [phone] (ASCOM)    Buccola,Allan C 11/19/2018, 3:00 PM

## 2018-11-19 NOTE — Progress Notes (Signed)
[employer] - [place] [facility]                                                                                                                                                                                  Patient Demographics   James BakerHarold Mcbride, is a 63 y.o. male, DOB - [birth year], ZOX:[id number]  Admit date - 11/18/2018   Admitting Physician James StallKaty Dodd Mayo, MD  Outpatient Primary MD for the patient is James QueenSelvidge, William M, MD   LOS - 1  Subjective: Patient seen by me he was very lethargic and sleepy Unable to provide any review of systems   Review of Systems:   CONSTITUTIONAL: Sleepy and lethargic  Vitals:   Vitals:   11/19/18 1347 11/19/18 1400 11/19/18 1500 11/19/18 1505  BP: 111/89 101/62 (!) 95/56   Pulse: (!) 45 (!) 43 (!) 40 (!) 40  Resp: 16 16 10 14   Temp:      TempSrc:      SpO2: 99% 94% (!) 82% 95%  Weight: (!) 171.8 kg     Height: 6' (1.829 Mcbride)       Wt Readings from Last 3 Encounters:  11/19/18 (!) 171.8 kg  10/12/17 (!) 165.6 kg  07/18/16 (!) 159.7 kg     Intake/Output Summary (Last 24 hours) at 11/19/2018 1559 Last data filed at 11/19/2018 1330 Gross per 24 hour  Intake 240 ml  Output 625 ml  Net -385 ml    Physical Exam:   GENERAL: Morbidly obese male ill-appearing HEAD, EYES, EARS, NOSE AND THROAT: Atraumatic, normocephalic. Extraocular muscles are intact. Pupils equal and reactive to light. Sclerae anicteric. No conjunctival injection. No oro-pharyngeal erythema.  NECK: Supple. There is no jugular venous distention. No bruits, no lymphadenopathy, no thyromegaly.  HEART: Regular rate and rhythm,. No murmurs, no rubs, no clicks.  LUNGS: Limited breath sounds due to his obese body habitus ABDOMEN: Soft, flat, nontender, nondistended. Has good bowel sounds. No hepatosplenomegaly appreciated.  EXTREMITIES: Positive edema  nEUROLOGIC: Patient very drowsy SKIN: Moist and warm with no rashes appreciated.  Psych: Not anxious,  depressed LN: No inguinal LN enlargement    Antibiotics   Anti-infectives (From admission, onward)   None      Medications   Scheduled Meds: . aspirin  81 mg Oral Daily  . atorvastatin  10 mg Oral Daily  . enoxaparin (LOVENOX) injection  40 mg Subcutaneous Q12H  . fluticasone  1 spray Each Nare BID  . furosemide  60 mg Intravenous BID  . levETIRAcetam  750 mg Oral BID  . miconazole  1 application Topical BID  . PHENobarbital  97.2 mg Oral QHS  . sodium chloride  flush  3 mL Intravenous Q12H   Continuous Infusions: . sodium chloride     PRN Meds:.sodium chloride, acetaminophen, ondansetron (ZOFRAN) IV, sodium chloride, sodium chloride flush   Data Review:   Micro Results Recent Results (from the past 240 hour(s))  SARS Coronavirus 2 (CEPHEID - Performed in [facility] hospital lab), Hosp Order     Status: None   Collection Time: 11/18/18 12:00 PM  Result Value Ref Range Status   SARS Coronavirus 2 NEGATIVE NEGATIVE Final    Comment: (NOTE) If result is NEGATIVE SARS-CoV-2 target nucleic acids are NOT DETECTED. The SARS-CoV-2 RNA is generally detectable in upper and lower  respiratory specimens during the acute phase of infection. The lowest  concentration of SARS-CoV-2 viral copies this assay can detect is 250  copies / mL. A negative result does not preclude SARS-CoV-2 infection  and should not be used as the sole basis for treatment or other  patient management decisions.  A negative result may occur with  improper specimen collection / handling, submission of specimen other  than nasopharyngeal swab, presence of viral mutation(s) within the  areas targeted by this assay, and inadequate number of viral copies  (<250 copies / mL). A negative result must be combined with clinical  observations, patient history, and epidemiological information. If result is POSITIVE SARS-CoV-2 target nucleic acids are DETECTED. The SARS-CoV-2 RNA is generally detectable in upper and  lower  respiratory specimens dur ing the acute phase of infection.  Positive  results are indicative of active infection with SARS-CoV-2.  Clinical  correlation with patient history and other diagnostic information is  necessary to determine patient infection status.  Positive results do  not rule out bacterial infection or co-infection with other viruses. If result is PRESUMPTIVE POSTIVE SARS-CoV-2 nucleic acids MAY BE PRESENT.   A presumptive positive result was obtained on the submitted specimen  and confirmed on repeat testing.  While 2019 novel coronavirus  (SARS-CoV-2) nucleic acids may be present in the submitted sample  additional confirmatory testing may be necessary for epidemiological  and / or clinical management purposes  to differentiate between  SARS-CoV-2 and other Sarbecovirus currently known to infect humans.  If clinically indicated additional testing with an alternate test  methodology [phone]) is advised. The SARS-CoV-2 RNA is generally  detectable in upper and lower respiratory sp ecimens during the acute  phase of infection. The expected result is Negative. Fact Sheet for Patients:  BoilerBrush.com.cy Fact Sheet for Healthcare Providers: https://pope.com/ This test is not yet approved or cleared by the [place] FDA and has been authorized for detection and/or diagnosis of SARS-CoV-2 by FDA under an Emergency Use Authorization (EUA).  This EUA will remain in effect (meaning this test can be used) for the duration of the COVID-19 declaration under Section 564(b)(1) of the Act, 21 U.S.C. section 360bbb-3(b)(1), unless the authorization is terminated or revoked sooner. Performed at [facility], [address] [place], [place] [address]   MRSA PCR Screening     Status: None   Collection Time: 11/19/18  1:50 PM  Result Value Ref Range Status   MRSA by PCR NEGATIVE NEGATIVE Final    Comment:         The GeneXpert MRSA Assay (FDA approved for NASAL specimens only), is one component of a comprehensive MRSA colonization surveillance program. It is not intended to diagnose MRSA infection nor to guide or monitor treatment for MRSA infections. Performed at [facility], [address] [place],  [place] [address]     Radiology Reports Dg Pelvis 1-2 Views  Result Date: 11/18/2018 CLINICAL DATA:  Pelvic pain. EXAM: PELVIS - 1-2 VIEW COMPARISON:  None. FINDINGS: There is no evidence of pelvic fracture or diastasis. No pelvic bone lesions are seen. IMPRESSION: Negative. Electronically Signed   By: Lupita Raider Mcbride.D.   On: 11/18/2018 09:36   Dg Elbow 2 Views Left  Result Date: 11/18/2018 CLINICAL DATA:  Status post closed reduction of elbow dislocation. EXAM: LEFT ELBOW - 1 VIEW COMPARISON:  11/18/2018 FINDINGS: A single lateral radiograph demonstrates interval reduction of the previously seen dislocations of the radius and ulna. Olecranon spurring is noted. IMPRESSION: Interval reduction of elbow dislocation. Electronically Signed   By: Sebastian Ache Mcbride.D.   On: 11/18/2018 11:28   Dg Elbow Complete Left  Result Date: 11/18/2018 CLINICAL DATA:  Left elbow pain after fall five days ago. EXAM: LEFT ELBOW - COMPLETE 3+ VIEW COMPARISON:  Radiographs of Nov 13, 2018. FINDINGS: There is posterior dislocation of the proximal radius and ulna. Mild olecranon spurring is noted. Several well-corticated bone densities are again noted. No definite fracture is noted. IMPRESSION: Posterior dislocation of proximal radius and ulna relative to distal left humerus. No definite fracture is noted. Electronically Signed   By: Lupita Raider Mcbride.D.   On: 11/18/2018 09:31   Dg Elbow Complete Left  Result Date: 11/13/2018 CLINICAL DATA:  Recent slip and fall with left elbow pain, initial encounter EXAM: LEFT ELBOW - COMPLETE 3+ VIEW COMPARISON:  None. FINDINGS: Olecranon spurring is noted. Small bony  densities are noted adjacent to the articulation of the humerus with both the radius and ulna medially and laterally. No definitive donor sites are seen in these may be chronic in nature. Generalized soft tissue swelling is noted about elbow joint. Olecranon spur is noted. Elevation of the fat pads is noted consistent with acute joint effusion. IMPRESSION: Soft tissue changes as described. Bony densities adjacent to the elbow joint which appear well corticated and no donor sites are seen to suggest acute fracture. Nonemergent MRI may be helpful if symptomatology persists. Electronically Signed   By: Alcide Clever Mcbride.D.   On: 11/13/2018 22:10   Dg Wrist Complete Left  Result Date: 11/18/2018 CLINICAL DATA:  Left wrist pain after fall 5 days ago EXAM: LEFT WRIST - COMPLETE 3+ VIEW COMPARISON:  None. FINDINGS: There is no evidence of fracture or dislocation. There is no evidence of arthropathy or other focal bone abnormality. Soft tissues are unremarkable. IMPRESSION: Negative. Electronically Signed   By: Lupita Raider Mcbride.D.   On: 11/18/2018 09:33   Ct Angio Chest Pe W Or Wo Contrast  Result Date: 11/19/2018 CLINICAL DATA:  Morbid obesity.  Abnormal chest radiograph EXAM: CT ANGIOGRAPHY CHEST WITH CONTRAST TECHNIQUE: Multidetector CT imaging of the chest was performed using the standard protocol during bolus administration of intravenous contrast. Multiplanar CT image reconstructions and MIPs were obtained to evaluate the vascular anatomy. CONTRAST:  34mL OMNIPAQUE IOHEXOL 350 MG/ML SOLN COMPARISON:  Radiograph 11/18/2018 FINDINGS: Cardiovascular:  Heart is enlarged.  No pericardial fluid. Exam is limited by respiratory motion and body habitus. No filling defects evident within the pulmonary arteries identified. The distal lower lobe pulmonary arteries are difficult to evaluate. Mediastinum/Nodes: No axillary supraclavicular adenopathy. No mediastinal and Lungs/Pleura: There is bibasilar linear peribronchial  thickening and atelectasis within the medial LEFT and RIGHT lower lobe. No air bronchograms. Upper lungs relatively clear. Upper Abdomen: No acute abnormality. Musculoskeletal: No  chest wall abnormality. No acute or significant osseous findings. Review of the MIP images confirms the above findings. IMPRESSION: 1. Suboptimal exam due to patient respiratory motion and large body habitus. With this caveat, no evidence of acute pulmonary embolism. 2. Severe medial bibasilar atelectasis. Potential superimposed pneumonia or aspiration pneumonitis. Electronically Signed   By: Genevive Bi Mcbride.D.   On: 11/19/2018 13:49   US Venous Img Lower Unilateral Left  Result Date: 11/18/2018 CLINICAL DATA:  Left lower extremity pain and edema after suffering a fall this Sunday. Evaluate for DVT. EXAM: LEFT LOWER EXTREMITY VENOUS DOPPLER ULTRASOUND TECHNIQUE: Gray-scale sonography with graded compression, as well as color Doppler and duplex ultrasound were performed to evaluate the lower extremity deep venous systems from the level of the common femoral vein and including the common femoral, femoral, profunda femoral, popliteal and calf veins including the posterior tibial, peroneal and gastrocnemius veins when visible. The superficial great saphenous vein was also interrogated. Spectral Doppler was utilized to evaluate flow at rest and with distal augmentation maneuvers in the common femoral, femoral and popliteal veins. COMPARISON:  None. FINDINGS: Examination is degraded due to patient body habitus. Contralateral Common Femoral Vein: Respiratory phasicity is normal and symmetric with the symptomatic side. No evidence of thrombus. Normal compressibility. Common Femoral Vein: No evidence of thrombus. Normal compressibility, respiratory phasicity and response to augmentation. Saphenofemoral Junction: No evidence of thrombus. Normal compressibility and flow on color Doppler imaging. Profunda Femoral Vein: No evidence of thrombus.  Normal compressibility and flow on color Doppler imaging. Femoral Vein: No evidence of thrombus. Normal compressibility, respiratory phasicity and response to augmentation. Popliteal Vein: No evidence of thrombus. Normal compressibility, respiratory phasicity and response to augmentation. Calf Veins: Not well visualized. Superficial Great Saphenous Vein: No evidence of thrombus. Normal compressibility. Venous Reflux:  None. Other Findings: There is a minimal amount of subcutaneous edema at the level of the left calf (images 34 and 35). IMPRESSION: No evidence of DVT within the left lower extremity. Electronically Signed   By: Simonne Come Mcbride.D.   On: 11/18/2018 11:15   Dg Chest Portable 1 View  Result Date: 11/18/2018 CLINICAL DATA:  Decreased oxygen saturation.  Hypertension. EXAM: PORTABLE CHEST 1 VIEW COMPARISON:  October 12, 2017 FINDINGS: There is cardiomegaly with pulmonary venous hypertension. There is no appreciable interstitial edema. There is no airspace consolidation or pleural effusion appreciable. No adenopathy. No bone lesions. IMPRESSION: Persistent pulmonary vascular congestion without overt edema or consolidation. No adenopathy evident. Electronically Signed   By: Bretta Bang III Mcbride.D.   On: 11/18/2018 10:25   Dg Femur Min 2 Views Left  Result Date: 11/18/2018 CLINICAL DATA:  Left leg pain after fall 5 days ago. EXAM: LEFT FEMUR 2 VIEWS COMPARISON:  None. FINDINGS: There is no evidence of fracture or other focal bone lesions. Soft tissues are unremarkable. IMPRESSION: Negative. Electronically Signed   By: Lupita Raider Mcbride.D.   On: 11/18/2018 09:34     CBC Recent Labs  Lab 11/18/18 0837 11/19/18 0517  WBC 5.4 4.6  HGB 10.1* 10.2*  HCT 31.8* 34.6*  PLT 130* 114*  MCV 83.9 89.4  MCH 26.6 26.4  MCHC 31.8 29.5*  RDW 15.5 15.5  LYMPHSABS 1.8  --   MONOABS 0.5  --   EOSABS 0.1  --   BASOSABS 0.0  --     Chemistries  Recent Labs  Lab 11/18/18 0837 11/19/18 0517  NA 138  140  K 4.0 4.9  CL 105 106  CO2 26 29  GLUCOSE 127* 100*  BUN 18 24*  CREATININE 0.89 1.07  CALCIUM 10.6* 10.6*  AST 18  --   ALT 16  --   ALKPHOS 112  --   BILITOT 0.4  --    ------------------------------------------------------------------------------------------------------------------ estimated creatinine clearance is 113.7 mL/min (by C-G formula based on SCr of 1.07 mg/dL). ------------------------------------------------------------------------------------------------------------------ Recent Labs    11/19/18 0517  HGBA1C 5.5   ------------------------------------------------------------------------------------------------------------------ No results for input(s): CHOL, HDL, LDLCALC, TRIG, CHOLHDL, LDLDIRECT in the last 72 hours. ------------------------------------------------------------------------------------------------------------------ No results for input(s): TSH, T4TOTAL, T3FREE, THYROIDAB in the last 72 hours.  Invalid input(s): FREET3 ------------------------------------------------------------------------------------------------------------------ Recent Labs    11/19/18 0517 11/19/18 0518  VITAMINB12  --  202  FOLATE 9.4  --   FERRITIN 37  --   TIBC 291  --   IRON 26*  --     Coagulation profile No results for input(s): INR, PROTIME in the last 168 hours.  No results for input(s): DDIMER in the last 72 hours.  Cardiac Enzymes No results for input(s): CKMB, TROPONINI, MYOGLOBIN in the last 168 hours.  Invalid input(s): CK ------------------------------------------------------------------------------------------------------------------ Invalid input(s): POCBNP    Assessment & Plan  Patient is 63 year old with likely severe sleep apnea  Acute hypercarbic respiratory failure I will move patient to the ICU Discussed with the intensivist Likely related to obesity hypoventilation syndrome and untreated sleep apnea Patient will be placed on  BiPAP    Acute on chronic diastolic congestive heart failure- last echo 2016 with EF 55 to 60%.   Continue IV Lasix  Left elbow dislocation- due to mechanical fall -Left elbow was reduced by Dr. Rosita Kea  -Needs to follow-up with Dr. Rosita Kea as an outpatient  Hypertension- patient's blood pressure medications on hold  History of stroke-  -Continue aspirin and Lipitor  History of seizures-no seizure-like activity recently -Continue home phenobarbital and Keppra  Normocytic anemia- no active bleeding  Hyperglycemia -Hemoglobin A1c 5.5 not a diabetic  OSA -Patient has been noncompliant with CPAP       Code Status Orders  (From admission, onward)         Start     Ordered   11/18/18 1457  Full code  Continuous     11/18/18 1456        Code Status History    Date Active Date Inactive Code Status Order ID Comments User Context   10/27/2014 2202 10/28/2014 2200 Full Code [id number]  Enid Baas, MD Inpatient       I discussed with patient's sister regarding overall poor prognosis    Consults pulmonary critical care   DVT Prophylaxis  Lovenox  Lab Results  Component Value Date   PLT 114 (L) 11/19/2018     Time Spent in minutes 45 minutes of critical care time spent  Greater than 50% of time spent in care coordination and counseling patient regarding the condition and plan of care.   Auburn Bilberry Mcbride.D on 11/19/2018 at 3:59 PM  Between 7am to 6pm - Pager - [phone]  After 6pm go to [email or web address] - Social research officer, government  [employer]   Office  [phone]

## 2018-11-19 NOTE — Progress Notes (Signed)
Increased I-time to 1.0

## 2018-11-20 LAB — BLOOD GAS, ARTERIAL
Acid-Base Excess: 2.4 mmol/L — ABNORMAL HIGH (ref 0.0–2.0)
Bicarbonate: 31 mmol/L — ABNORMAL HIGH (ref 20.0–28.0)
Delivery systems: POSITIVE
Expiratory PAP: 8
FIO2: 0.3
Inspiratory PAP: 20
O2 Saturation: 91.4 %
Patient temperature: 37
RATE: 18 resp/min
pCO2 arterial: 66 mmHg (ref 32.0–48.0)
pH, Arterial: 7.28 — ABNORMAL LOW (ref 7.350–7.450)
pO2, Arterial: 70 mmHg — ABNORMAL LOW (ref 83.0–108.0)

## 2018-11-20 LAB — BASIC METABOLIC PANEL
Anion gap: 7 (ref 5–15)
BUN: 27 mg/dL — ABNORMAL HIGH (ref 8–23)
CO2: 29 mmol/L (ref 22–32)
Calcium: 11 mg/dL — ABNORMAL HIGH (ref 8.9–10.3)
Chloride: 106 mmol/L (ref 98–111)
Creatinine, Ser: 1.08 mg/dL (ref 0.61–1.24)
GFR calc Af Amer: >60 mL/min (ref >60)
GFR calc non Af Amer: >60 mL/min (ref >60)
Glucose, Bld: 70 mg/dL (ref 70–99)
Potassium: 4.8 mmol/L (ref 3.5–5.1)
Sodium: 142 mmol/L (ref 135–145)

## 2018-11-20 LAB — ECHOCARDIOGRAM COMPLETE
Height: 68 in
Weight: 6116.8 oz

## 2018-11-20 LAB — MAGNESIUM: Magnesium: 2.2 mg/dL (ref 1.7–2.4)

## 2018-11-20 LAB — PHOSPHORUS: Phosphorus: 3.8 mg/dL (ref 2.5–4.6)

## 2018-11-20 MED ORDER — IPRATROPIUM-ALBUTEROL 0.5-2.5 (3) MG/3ML IN SOLN
3.0000 mL | Freq: Four times a day (QID) | RESPIRATORY_TRACT | Status: DC
  Administered 2018-11-20 – 2018-11-21 (×6): 3 mL via RESPIRATORY_TRACT
  Filled 2018-11-20 (×6): qty 3

## 2018-11-20 NOTE — Progress Notes (Signed)
CRITICAL CARE NOTE        SUBJECTIVE FINDINGS & SIGNIFICANT EVENTS   Patient has improved clinically with significant mental status improvement.  Will optimize for downgrade to medical floor.    Had long discourse with Sister [name] who is next of kin and explained that patient will likely again deteriorate if he does not use BiPAP/CPAP at home and recommended bariatric evaluation due to obesity hypoventilation syndrome.   PAST MEDICAL HISTORY   Past Medical History:  Diagnosis Date  . Hypertension   . Seizures (HCC)   . Stroke (HCC) 2016  . Swelling      SURGICAL HISTORY   History reviewed. No pertinent surgical history.   FAMILY HISTORY   Family History  Problem Relation Age of Onset  . Kidney disease Mother   . Diabetes Mother   . Cancer Other      SOCIAL HISTORY   Social History   Tobacco Use  . Smoking status: Never Smoker  . Smokeless tobacco: Never Used  Substance Use Topics  . Alcohol use: No  . Drug use: No     MEDICATIONS   Current Medication:  Current Facility-Administered Medications:  .  0.9 %  sodium chloride infusion, 250 mL, Intravenous, PRN, Mayo, Allyn Kenner, MD .  acetaminophen (TYLENOL) tablet 650 mg, 650 mg, Oral, Q4H PRN, Mayo, Allyn Kenner, MD .  albuterol (PROVENTIL) (2.5 MG/3ML) 0.083% nebulizer solution 2.5 mg, 2.5 mg, Nebulization, Q4H, Tamisha Nordstrom, MD, 2.5 mg at 11/20/18 0419 .  aspirin chewable tablet 81 mg, 81 mg, Oral, Daily, Mayo, Allyn Kenner, MD, 81 mg at 11/19/18 1039 .  atorvastatin (LIPITOR) tablet 10 mg, 10 mg, Oral, Daily, Mayo, Allyn Kenner, MD, 10 mg at 11/19/18 1039 .  enoxaparin (LOVENOX) injection 40 mg, 40 mg, Subcutaneous, Q12H, Albina Billet, RPH, 40 mg at 11/19/18 2206 .  fluticasone (FLONASE) 50 MCG/ACT nasal spray 1 spray, 1  spray, Each Nare, BID, Mayo, Allyn Kenner, MD, 1 spray at 11/19/18 [phone] .  furosemide (LASIX) injection 60 mg, 60 mg, Intravenous, BID, Mayo, Allyn Kenner, MD, 60 mg at 11/19/18 1802 .  ipratropium-albuterol (DUONEB) 0.5-2.5 (3) MG/3ML nebulizer solution 3 mL, 3 mL, Nebulization, Q6H, Tukov-Yual, Magdalene S, NP .  levETIRAcetam (KEPPRA) 750 mg in sodium chloride 0.9 % 100 mL IVPB, 750 mg, Intravenous, Q12H, Tukov-Yual, Magdalene S, NP, Last Rate: 430 mL/hr at 11/19/18 2320, 750 mg at 11/19/18 2320 .  miconazole (MICOTIN) 2 % cream 1 application, 1 application, Topical, BID, Mayo, Allyn Kenner, MD, 1 application at 11/19/18 1043 .  ondansetron (ZOFRAN) injection 4 mg, 4 mg, Intravenous, Q6H PRN, Mayo, Allyn Kenner, MD .  PHENObarbital (LUMINAL) injection 65 mg, 65 mg, Intravenous, QHS, Tukov-Yual, Magdalene S, NP, 65 mg at 11/19/18 2341 .  sodium chloride (OCEAN) 0.65 % nasal spray 2 spray, 2 spray, Each Nare, PRN, Mayo, Allyn Kenner, MD .  sodium chloride flush (NS) 0.9 % injection 3 mL, 3 mL, Intravenous, Q12H, Mayo, Allyn Kenner, MD, 3 mL at 11/19/18 2206 .  sodium chloride flush (NS) 0.9 % injection 3 mL, 3 mL, Intravenous, PRN, Mayo, Allyn Kenner, MD    ALLERGIES   Vasotec [enalaprilat]    REVIEW OF SYSTEMS   10 point ROS conducted and is negative except for hunger.   PHYSICAL EXAMINATION   Vitals:   11/20/18 0500 11/20/18 0600  BP: (!) 162/69 (!) 169/70  Pulse: 62 71  Resp: 18 (!) 26  Temp:    SpO2: 95% (!) 89%  GENERAL:mild distress due to sob HEAD: Normocephalic, atraumatic.  EYES: Pupils equal, round, reactive to light.  No scleral icterus.  MOUTH: Moist mucosal membrane. NECK: Supple. No thyromegaly. No nodules. No JVD.  PULMONARY: mild bibasilar crackles CARDIOVASCULAR: S1 and S2. Regular rate and rhythm. No murmurs, rubs, or gallops.  GASTROINTESTINAL: Soft, nontender, non-distended. No masses. Positive bowel sounds. No hepatosplenomegaly.  MUSCULOSKELETAL: No swelling, clubbing,  or edema.  NEUROLOGIC: Mild distress due to acute illness SKIN:intact,warm,dry   LABS AND IMAGING     LAB RESULTS: Recent Labs  Lab 11/18/18 0837 11/19/18 0517 11/20/18 0512  NA 138 140 142  K 4.0 4.9 4.8  CL 105 106 106  CO2 26 29 29   BUN 18 24* 27*  CREATININE 0.89 1.07 1.08  GLUCOSE 127* 100* 70   Recent Labs  Lab 11/18/18 0837 11/19/18 0517  HGB 10.1* 10.2*  HCT 31.8* 34.6*  WBC 5.4 4.6  PLT 130* 114*     IMAGING RESULTS: Ct Angio Chest Pe W Or Wo Contrast  Result Date: 11/19/2018 CLINICAL DATA:  Morbid obesity.  Abnormal chest radiograph EXAM: CT ANGIOGRAPHY CHEST WITH CONTRAST TECHNIQUE: Multidetector CT imaging of the chest was performed using the standard protocol during bolus administration of intravenous contrast. Multiplanar CT image reconstructions and MIPs were obtained to evaluate the vascular anatomy. CONTRAST:  75mL OMNIPAQUE IOHEXOL 350 MG/ML SOLN COMPARISON:  Radiograph 11/18/2018 FINDINGS: Cardiovascular:  Heart is enlarged.  No pericardial fluid. Exam is limited by respiratory motion and body habitus. No filling defects evident within the pulmonary arteries identified. The distal lower lobe pulmonary arteries are difficult to evaluate. Mediastinum/Nodes: No axillary supraclavicular adenopathy. No mediastinal and Lungs/Pleura: There is bibasilar linear peribronchial thickening and atelectasis within the medial LEFT and RIGHT lower lobe. No air bronchograms. Upper lungs relatively clear. Upper Abdomen: No acute abnormality. Musculoskeletal: No chest wall abnormality. No acute or significant osseous findings. Review of the MIP images confirms the above findings. IMPRESSION: 1. Suboptimal exam due to patient respiratory motion and large body habitus. With this caveat, no evidence of acute pulmonary embolism. 2. Severe medial bibasilar atelectasis. Potential superimposed pneumonia or aspiration pneumonitis. Electronically Signed   By: Genevive BiStewart  Edmunds M.D.   On:  11/19/2018 13:49      ASSESSMENT AND PLAN    -Multidisciplinary rounds held today  Acute Hypoxic and hypercarbic respiratory Failure - on BiPAP -Changed pressures and discussed with respiratory therapist -ABG with severe hypercapnia -Continues to worsen may need endotracheal intubation -continue Full MV support -continue Bronchodilator Therapy -Possibly due to obesity related asthma/obesity hypoventilation syndrome/ severe obstructive sleep apnea/diastolic heart failure   Chronic diastolic CHF -Cardiology consultation appreciate input -oxygen as needed -Lasix as tolerated -follow up cardiac enzymes as indicated ICU monitoring    NEUROLOGY Allergic with hypercapnic encephalopathy Wake up assessment pending   ID -continue IV abx as prescibed -follow up cultures  GI/Nutrition GI PROPHYLAXIS as indicated DIET-->TF's as tolerated Constipation protocol as indicated  ENDO - ICU hypoglycemic\Hyperglycemia protocol -check FSBS per protocol   ELECTROLYTES -follow labs as needed -replace as needed -pharmacy consultation   DVT/GI PRX ordered -SCDs  TRANSFUSIONS AS NEEDED MONITOR FSBS ASSESS the need for LABS as needed   Critical care provider statement:   Critical care time (minutes): 32  Critical care time was exclusive of: Separately billable procedures and treating other patients  Critical care was necessary to treat or prevent imminent or life-threatening deterioration of the following conditions:  Acute  hypoxemic and hypercarbic respiratory failure, systolic heart  failure, morbid obesity, multiple comorbid conditions  Critical care was time spent personally by me on the following activities: Development of treatment plan with patient or surrogate, discussions with consultants, evaluation of patient's response to treatment, examination of patient, obtaining history from patient or surrogate, ordering and performing treatments and  interventions, ordering and review of laboratory studies and re-evaluation of patient's condition.  I assumed direction of critical care for this patient from another provider in my specialty: no   This document was prepared using Dragon voice recognition software and may include unintentional dictation errors.   Vida Rigger, M.D.  Division of Pulmonary & Critical Care Medicine  [facility]

## 2018-11-20 NOTE — Progress Notes (Signed)
[employer] - [place] at [facility]                                                                                                                                                                                  Patient Demographics   James Mcbride, is a 63 y.o. male, DOB - 02/22/1956, ZOX:[id number]  Admit date - 11/18/2018   Admitting Physician Campbell Stall, MD  Outpatient Primary MD for the patient is [name], MD   LOS - 2  Subjective: Patient remains on BiPAP he states that he does not feel well   CONSTITUTIONAL: No documented fever. No fatigue, weakness. No weight gain, no weight loss.  EYES: No blurry or double vision.  ENT: No tinnitus. No postnasal drip. No redness of the oropharynx.  RESPIRATORY: No cough, no wheeze, no hemoptysis.  Positive dyspnea.  CARDIOVASCULAR: No chest pain. No orthopnea. No palpitations. No syncope.  GASTROINTESTINAL: No nausea, no vomiting or diarrhea. No abdominal pain. No melena or hematochezia.  GENITOURINARY:  No urgency. No frequency. No dysuria. No hematuria. No obstructive symptoms. No discharge. No pain. No significant abnormal bleeding ENDOCRINE: No polyuria or nocturia. No heat or cold intolerance.  HEMATOLOGY: No anemia. No bruising. No bleeding. No purpura. No petechiae INTEGUMENTARY: No rashes. No lesions.  MUSCULOSKELETAL: No arthritis. No swelling. No gout.  NEUROLOGIC: No numbness, tingling, or ataxia. No seizure-type activity.  PSYCHIATRIC: No anxiety. No insomnia. No ADD.      Review of Systems:   CONSTITUTIONAL:     Vitals:   Vitals:   11/20/18 0500 11/20/18 0600 11/20/18 0742 11/20/18 1407  BP: (!) 162/69 (!) 169/70    Pulse: 62 71 68   Resp: 18 (!) 26 (!) 22   Temp:      TempSrc:      SpO2: 95% (!) 89% 95% 94%  Weight:      Height:        Wt Readings from Last 3 Encounters:  11/20/18 (!) 172.3 kg  10/12/17 (!) 165.6 kg  07/18/16 (!) 159.7 kg     Intake/Output Summary (Last  24 hours) at 11/20/2018 1455 Last data filed at 11/20/2018 1247 Gross per 24 hour  Intake 120 ml  Output 350 ml  Net -230 ml    Physical Exam:   GENERAL: Morbidly obese male ill-appearing HEAD, EYES, EARS, NOSE AND THROAT: Atraumatic, normocephalic. Extraocular muscles are intact. Pupils equal and reactive to light. Sclerae anicteric. No conjunctival injection. No oro-pharyngeal erythema.  NECK: Supple. There is no jugular venous distention. No bruits, no lymphadenopathy, no thyromegaly.  HEART: Regular rate and rhythm,. No murmurs, no rubs, no clicks.  LUNGS: Limited breath  sounds due to his obese body habitus ABDOMEN: Soft, flat, nontender, nondistended. Has good bowel sounds. No hepatosplenomegaly appreciated.  EXTREMITIES: Positive edema  nEUROLOGIC: Patient very drowsy SKIN: Moist and warm with no rashes appreciated.  Psych: Not anxious, depressed LN: No inguinal LN enlargement    Antibiotics   Anti-infectives (From admission, onward)   None      Medications   Scheduled Meds: . aspirin  81 mg Oral Daily  . atorvastatin  10 mg Oral Daily  . enoxaparin (LOVENOX) injection  40 mg Subcutaneous Q12H  . fluticasone  1 spray Each Nare BID  . furosemide  60 mg Intravenous BID  . ipratropium-albuterol  3 mL Nebulization Q6H  . miconazole  1 application Topical BID  . PHENObarbital  65 mg Intravenous QHS  . sodium chloride flush  3 mL Intravenous Q12H   Continuous Infusions: . sodium chloride    . levETIRAcetam 750 mg (11/20/18 1223)   PRN Meds:.sodium chloride, acetaminophen, ondansetron (ZOFRAN) IV, sodium chloride, sodium chloride flush   Data Review:   Micro Results Recent Results (from the past 240 hour(s))  SARS Coronavirus 2 (CEPHEID - Performed in [facility] hospital lab), Hosp Order     Status: None   Collection Time: 11/18/18 12:00 PM  Result Value Ref Range Status   SARS Coronavirus 2 NEGATIVE NEGATIVE Final    Comment: (NOTE) If result is  NEGATIVE SARS-CoV-2 target nucleic acids are NOT DETECTED. The SARS-CoV-2 RNA is generally detectable in upper and lower  respiratory specimens during the acute phase of infection. The lowest  concentration of SARS-CoV-2 viral copies this assay can detect is 250  copies / mL. A negative result does not preclude SARS-CoV-2 infection  and should not be used as the sole basis for treatment or other  patient management decisions.  A negative result may occur with  improper specimen collection / handling, submission of specimen other  than nasopharyngeal swab, presence of viral mutation(s) within the  areas targeted by this assay, and inadequate number of viral copies  (<250 copies / mL). A negative result must be combined with clinical  observations, patient history, and epidemiological information. If result is POSITIVE SARS-CoV-2 target nucleic acids are DETECTED. The SARS-CoV-2 RNA is generally detectable in upper and lower  respiratory specimens dur ing the acute phase of infection.  Positive  results are indicative of active infection with SARS-CoV-2.  Clinical  correlation with patient history and other diagnostic information is  necessary to determine patient infection status.  Positive results do  not rule out bacterial infection or co-infection with other viruses. If result is PRESUMPTIVE POSTIVE SARS-CoV-2 nucleic acids MAY BE PRESENT.   A presumptive positive result was obtained on the submitted specimen  and confirmed on repeat testing.  While 2019 novel coronavirus  (SARS-CoV-2) nucleic acids may be present in the submitted sample  additional confirmatory testing may be necessary for epidemiological  and / or clinical management purposes  to differentiate between  SARS-CoV-2 and other Sarbecovirus currently known to infect humans.  If clinically indicated additional testing with an alternate test  methodology [phone]) is advised. The SARS-CoV-2 RNA is generally  detectable  in upper and lower respiratory sp ecimens during the acute  phase of infection. The expected result is Negative. Fact Sheet for Patients:  BoilerBrush.com.cy Fact Sheet for Healthcare Providers: https://pope.com/ This test is not yet approved or cleared by the [place] FDA and has been authorized for detection and/or diagnosis of SARS-CoV-2 by FDA under  an Emergency Use Authorization (EUA).  This EUA will remain in effect (meaning this test can be used) for the duration of the COVID-19 declaration under Section 564(b)(1) of the Act, 21 U.S.C. section 360bbb-3(b)(1), unless the authorization is terminated or revoked sooner. Performed at [facility], [address] [place], [place] [address]   MRSA PCR Screening     Status: None   Collection Time: 11/19/18  1:50 PM  Result Value Ref Range Status   MRSA by PCR NEGATIVE NEGATIVE Final    Comment:        The GeneXpert MRSA Assay (FDA approved for NASAL specimens only), is one component of a comprehensive MRSA colonization surveillance program. It is not intended to diagnose MRSA infection nor to guide or monitor treatment for MRSA infections. Performed at [facility], [address] [place], [place] [address]     Radiology Reports Dg Pelvis 1-2 Views  Result Date: 11/18/2018 CLINICAL DATA:  Pelvic pain. EXAM: PELVIS - 1-2 VIEW COMPARISON:  None. FINDINGS: There is no evidence of pelvic fracture or diastasis. No pelvic bone lesions are seen. IMPRESSION: Negative. Electronically Signed   By: Lupita RaiderJames  Green Jr M.D.   On: 11/18/2018 09:36   Dg Elbow 2 Views Left  Result Date: 11/18/2018 CLINICAL DATA:  Status post closed reduction of elbow dislocation. EXAM: LEFT ELBOW - 1 VIEW COMPARISON:  11/18/2018 FINDINGS: A single lateral radiograph demonstrates interval reduction of the previously seen dislocations of the radius and ulna. Olecranon spurring is noted.  IMPRESSION: Interval reduction of elbow dislocation. Electronically Signed   By: Sebastian AcheAllen  Grady M.D.   On: 11/18/2018 11:28   Dg Elbow Complete Left  Result Date: 11/18/2018 CLINICAL DATA:  Left elbow pain after fall five days ago. EXAM: LEFT ELBOW - COMPLETE 3+ VIEW COMPARISON:  Radiographs of Nov 13, 2018. FINDINGS: There is posterior dislocation of the proximal radius and ulna. Mild olecranon spurring is noted. Several well-corticated bone densities are again noted. No definite fracture is noted. IMPRESSION: Posterior dislocation of proximal radius and ulna relative to distal left humerus. No definite fracture is noted. Electronically Signed   By: Lupita RaiderJames  Green Jr M.D.   On: 11/18/2018 09:31   Dg Elbow Complete Left  Result Date: 11/13/2018 CLINICAL DATA:  Recent slip and fall with left elbow pain, initial encounter EXAM: LEFT ELBOW - COMPLETE 3+ VIEW COMPARISON:  None. FINDINGS: Olecranon spurring is noted. Small bony densities are noted adjacent to the articulation of the humerus with both the radius and ulna medially and laterally. No definitive donor sites are seen in these may be chronic in nature. Generalized soft tissue swelling is noted about elbow joint. Olecranon spur is noted. Elevation of the fat pads is noted consistent with acute joint effusion. IMPRESSION: Soft tissue changes as described. Bony densities adjacent to the elbow joint which appear well corticated and no donor sites are seen to suggest acute fracture. Nonemergent MRI may be helpful if symptomatology persists. Electronically Signed   By: Alcide CleverMark  Lukens M.D.   On: 11/13/2018 22:10   Dg Wrist Complete Left  Result Date: 11/18/2018 CLINICAL DATA:  Left wrist pain after fall 5 days ago EXAM: LEFT WRIST - COMPLETE 3+ VIEW COMPARISON:  None. FINDINGS: There is no evidence of fracture or dislocation. There is no evidence of arthropathy or other focal bone abnormality. Soft tissues are unremarkable. IMPRESSION: Negative. Electronically  Signed   By: Lupita RaiderJames  Green Jr M.D.   On: 11/18/2018 09:33   Ct Angio  Chest Pe W Or Wo Contrast  Result Date: 11/19/2018 CLINICAL DATA:  Morbid obesity.  Abnormal chest radiograph EXAM: CT ANGIOGRAPHY CHEST WITH CONTRAST TECHNIQUE: Multidetector CT imaging of the chest was performed using the standard protocol during bolus administration of intravenous contrast. Multiplanar CT image reconstructions and MIPs were obtained to evaluate the vascular anatomy. CONTRAST:  75mL OMNIPAQUE IOHEXOL 350 MG/ML SOLN COMPARISON:  Radiograph 11/18/2018 FINDINGS: Cardiovascular:  Heart is enlarged.  No pericardial fluid. Exam is limited by respiratory motion and body habitus. No filling defects evident within the pulmonary arteries identified. The distal lower lobe pulmonary arteries are difficult to evaluate. Mediastinum/Nodes: No axillary supraclavicular adenopathy. No mediastinal and Lungs/Pleura: There is bibasilar linear peribronchial thickening and atelectasis within the medial LEFT and RIGHT lower lobe. No air bronchograms. Upper lungs relatively clear. Upper Abdomen: No acute abnormality. Musculoskeletal: No chest wall abnormality. No acute or significant osseous findings. Review of the MIP images confirms the above findings. IMPRESSION: 1. Suboptimal exam due to patient respiratory motion and large body habitus. With this caveat, no evidence of acute pulmonary embolism. 2. Severe medial bibasilar atelectasis. Potential superimposed pneumonia or aspiration pneumonitis. Electronically Signed   By: Genevive Bi M.D.   On: 11/19/2018 13:49   US Venous Img Lower Unilateral Left  Result Date: 11/18/2018 CLINICAL DATA:  Left lower extremity pain and edema after suffering a fall this Sunday. Evaluate for DVT. EXAM: LEFT LOWER EXTREMITY VENOUS DOPPLER ULTRASOUND TECHNIQUE: Gray-scale sonography with graded compression, as well as color Doppler and duplex ultrasound were performed to evaluate the lower extremity deep venous  systems from the level of the common femoral vein and including the common femoral, femoral, profunda femoral, popliteal and calf veins including the posterior tibial, peroneal and gastrocnemius veins when visible. The superficial great saphenous vein was also interrogated. Spectral Doppler was utilized to evaluate flow at rest and with distal augmentation maneuvers in the common femoral, femoral and popliteal veins. COMPARISON:  None. FINDINGS: Examination is degraded due to patient body habitus. Contralateral Common Femoral Vein: Respiratory phasicity is normal and symmetric with the symptomatic side. No evidence of thrombus. Normal compressibility. Common Femoral Vein: No evidence of thrombus. Normal compressibility, respiratory phasicity and response to augmentation. Saphenofemoral Junction: No evidence of thrombus. Normal compressibility and flow on color Doppler imaging. Profunda Femoral Vein: No evidence of thrombus. Normal compressibility and flow on color Doppler imaging. Femoral Vein: No evidence of thrombus. Normal compressibility, respiratory phasicity and response to augmentation. Popliteal Vein: No evidence of thrombus. Normal compressibility, respiratory phasicity and response to augmentation. Calf Veins: Not well visualized. Superficial Great Saphenous Vein: No evidence of thrombus. Normal compressibility. Venous Reflux:  None. Other Findings: There is a minimal amount of subcutaneous edema at the level of the left calf (images 34 and 35). IMPRESSION: No evidence of DVT within the left lower extremity. Electronically Signed   By: Simonne Come M.D.   On: 11/18/2018 11:15   Dg Chest Portable 1 View  Result Date: 11/18/2018 CLINICAL DATA:  Decreased oxygen saturation.  Hypertension. EXAM: PORTABLE CHEST 1 VIEW COMPARISON:  October 12, 2017 FINDINGS: There is cardiomegaly with pulmonary venous hypertension. There is no appreciable interstitial edema. There is no airspace consolidation or pleural  effusion appreciable. No adenopathy. No bone lesions. IMPRESSION: Persistent pulmonary vascular congestion without overt edema or consolidation. No adenopathy evident. Electronically Signed   By: Bretta Bang III M.D.   On: 11/18/2018 10:25   Dg Femur Min 2 Views Left  Result Date: 11/18/2018 CLINICAL  DATA:  Left leg pain after fall 5 days ago. EXAM: LEFT FEMUR 2 VIEWS COMPARISON:  None. FINDINGS: There is no evidence of fracture or other focal bone lesions. Soft tissues are unremarkable. IMPRESSION: Negative. Electronically Signed   By: Lupita Raider M.D.   On: 11/18/2018 09:34     CBC Recent Labs  Lab 11/18/18 0837 11/19/18 0517  WBC 5.4 4.6  HGB 10.1* 10.2*  HCT 31.8* 34.6*  PLT 130* 114*  MCV 83.9 89.4  MCH 26.6 26.4  MCHC 31.8 29.5*  RDW 15.5 15.5  LYMPHSABS 1.8  --   MONOABS 0.5  --   EOSABS 0.1  --   BASOSABS 0.0  --     Chemistries  Recent Labs  Lab 11/18/18 0837 11/19/18 0517 11/20/18 0512  NA 138 140 142  K 4.0 4.9 4.8  CL 105 106 106  CO2 26 29 29   GLUCOSE 127* 100* 70  BUN 18 24* 27*  CREATININE 0.89 1.07 1.08  CALCIUM 10.6* 10.6* 11.0*  MG  --   --  2.2  AST 18  --   --   ALT 16  --   --   ALKPHOS 112  --   --   BILITOT 0.4  --   --    ------------------------------------------------------------------------------------------------------------------ estimated creatinine clearance is 112.9 mL/min (by C-G formula based on SCr of 1.08 mg/dL). ------------------------------------------------------------------------------------------------------------------ Recent Labs    11/19/18 0517  HGBA1C 5.5   ------------------------------------------------------------------------------------------------------------------ No results for input(s): CHOL, HDL, LDLCALC, TRIG, CHOLHDL, LDLDIRECT in the last 72 hours. ------------------------------------------------------------------------------------------------------------------ No results for input(s): TSH,  T4TOTAL, T3FREE, THYROIDAB in the last 72 hours.  Invalid input(s): FREET3 ------------------------------------------------------------------------------------------------------------------ Recent Labs    11/19/18 0517 11/19/18 0518  VITAMINB12  --  202  FOLATE 9.4  --   FERRITIN 37  --   TIBC 291  --   IRON 26*  --     Coagulation profile No results for input(s): INR, PROTIME in the last 168 hours.  No results for input(s): DDIMER in the last 72 hours.  Cardiac Enzymes No results for input(s): CKMB, TROPONINI, MYOGLOBIN in the last 168 hours.  Invalid input(s): CK ------------------------------------------------------------------------------------------------------------------ Invalid input(s): POCBNP    Assessment & Plan  Patient is 63 year old with likely severe sleep apnea  Acute hypercarbic respiratory failure Continue BiPAP Likely related to obesity hypoventilation syndrome and untreated sleep apnea CT chest shows no PE Likely atelectasis  Acute on chronic diastolic congestive heart failure- last echo 2016 with EF 55 to 60%.   Continue IV Lasix  Left elbow dislocation- due to mechanical fall -Left elbow was reduced by Dr. Rosita Kea  -Needs to follow-up with Dr. Rosita Kea as an outpatient  Hypertension- patient's blood pressure medications on hold  History of stroke-  -Continue aspirin and Lipitor  History of seizures-no seizure-like activity recently -Continue home phenobarbital and Keppra  Normocytic anemia- no active bleeding  Hyperglycemia -Hemoglobin A1c 5.5 not a diabetic  OSA -Patient has been noncompliant with CPAP       Code Status Orders  (From admission, onward)         Start     Ordered   11/18/18 1457  Full code  Continuous     11/18/18 1456        Code Status History    Date Active Date Inactive Code Status Order ID Comments User Context   10/27/2014 2202 10/28/2014 2200 Full Code [id number]  Enid Baas, MD Inpatient        I  discussed with patient's sister regarding overall poor prognosis    Consults pulmonary critical care   DVT Prophylaxis  Lovenox  Lab Results  Component Value Date   PLT 114 (L) 11/19/2018     Time Spent in minutes 35-minute greater than 50% of time spent in care coordination and counseling patient regarding the condition and plan of care.   Auburn Bilberry M.D on 11/20/2018 at 2:55 PM  Between 7am to 6pm - Pager - [phone]  After 6pm go to [email or web address] - Social research officer, government  [employer]   Office  [phone]

## 2018-11-20 NOTE — Progress Notes (Signed)
Pharmacy Electrolyte Monitoring Consult:  Pharmacy consulted to assist in monitoring and replacing electrolytes in this 63 y.o. male admitted on 11/18/2018 with Fall Patient with past medical history significant for Stroke, Seizure, Hypertension, and CHF.  Labs:  Sodium (mmol/L)  Date Value  11/20/2018 142  01/06/2014 138   Potassium (mmol/L)  Date Value  11/20/2018 4.8  01/06/2014 3.5   Magnesium (mg/dL)  Date Value  16/06/930 2.2   Phosphorus (mg/dL)  Date Value  35/57/3220 3.8   Calcium (mg/dL)  Date Value  25/42/7062 11.0 (H)   Calcium, Total (mg/dL)  Date Value  37/62/8315 10.0   Albumin (g/dL)  Date Value  17/61/6073 3.6  01/06/2014 3.4    Assessment/Plan: Patient ordered furosemide 60mg  IV BID.   No replacement warranted at this time.   Will replace for goal potassium 3.6-4 and goal magnesium 2.0-2.2.   Will obtain BMP/Magnesium with am labs.   Pharmacy will continue to monitor and adjust per consult.    Simpson,Michael L 11/20/2018 11:35 AM

## 2018-11-21 LAB — BASIC METABOLIC PANEL
Anion gap: 8 (ref 5–15)
BUN: 27 mg/dL — ABNORMAL HIGH (ref 8–23)
CO2: 29 mmol/L (ref 22–32)
Calcium: 10.7 mg/dL — ABNORMAL HIGH (ref 8.9–10.3)
Chloride: 105 mmol/L (ref 98–111)
Creatinine, Ser: 0.99 mg/dL (ref 0.61–1.24)
GFR calc Af Amer: >60 mL/min (ref >60)
GFR calc non Af Amer: >60 mL/min (ref >60)
Glucose, Bld: 82 mg/dL (ref 70–99)
Potassium: 4.5 mmol/L (ref 3.5–5.1)
Sodium: 142 mmol/L (ref 135–145)

## 2018-11-21 LAB — MAGNESIUM: Magnesium: 2 mg/dL (ref 1.7–2.4)

## 2018-11-21 MED ORDER — ISOSORBIDE MONONITRATE ER 30 MG PO TB24
30.0000 mg | ORAL_TABLET | Freq: Every day | ORAL | Status: DC
  Administered 2018-11-21 – 2018-11-24 (×4): 30 mg via ORAL
  Filled 2018-11-21 (×4): qty 1

## 2018-11-21 MED ORDER — IPRATROPIUM-ALBUTEROL 0.5-2.5 (3) MG/3ML IN SOLN
3.0000 mL | Freq: Three times a day (TID) | RESPIRATORY_TRACT | Status: DC
  Administered 2018-11-21 – 2018-11-24 (×8): 3 mL via RESPIRATORY_TRACT
  Filled 2018-11-21 (×9): qty 3

## 2018-11-21 MED ORDER — FUROSEMIDE 10 MG/ML IJ SOLN
4.0000 mg/h | INTRAVENOUS | Status: AC
  Administered 2018-11-21: 4 mg/h via INTRAVENOUS
  Filled 2018-11-21: qty 25

## 2018-11-21 NOTE — Progress Notes (Signed)
PT Cancellation Note  Patient Details Name: James Mcbride MRN: [id number] DOB: 1955-08-11   Cancelled Treatment:    Reason Eval/Treat Not Completed: Other (comment). Consult received and chart reviewed. Clarified with Dr. Rosita Kea for L UE NWB due to closed elbow dislocation. Upon arrival, pt has soaked all bed linen with urine and needs to be cleaned up. Informed RN. Will re-attempt at a later time.   [name] 11/21/2018, 2:45 PM  Elizabeth Palau, PT, DPT [phone]

## 2018-11-21 NOTE — Progress Notes (Signed)
Pharmacy Electrolyte Monitoring Consult:  Pharmacy consulted to assist in monitoring and replacing electrolytes in this 63 y.o. male admitted on 11/18/2018 with Fall Patient with past medical history significant for Stroke, Seizure, Hypertension, and CHF.  Labs:  Sodium (mmol/L)  Date Value  11/21/2018 142  01/06/2014 138   Potassium (mmol/L)  Date Value  11/21/2018 4.5  01/06/2014 3.5   Magnesium (mg/dL)  Date Value  73/42/8768 2.0   Phosphorus (mg/dL)  Date Value  11/57/2620 3.8   Calcium (mg/dL)  Date Value  35/59/7416 10.7 (H)   Calcium, Total (mg/dL)  Date Value  38/45/3646 10.0   Albumin (g/dL)  Date Value  80/32/1224 3.6  01/06/2014 3.4    Assessment/Plan: Patient ordered furosemide 60mg  IV BID.   No replacement warranted at this time.   Will replace for goal potassium 3.6-4 and goal magnesium 2.0-2.2.   Pharmacy will continue to monitor and adjust per consult.    Ronnald Ramp, PharmD, BCPS  11/21/2018 11:29 AM

## 2018-11-21 NOTE — Evaluation (Signed)
Physical Therapy Evaluation Patient Details Name: James Mcbride MRN: [id number] DOB: 1955-10-31 Today's Date: 11/21/2018   History of Present Illness  Pt admitted for acute on chronic heart failure. HIstory includes HTN, seizures, CVA. Currently came to hospital s/p fall resulting in L elbow dislocation. Pt is now s/p reduction on 11/18/18.  Clinical Impression  Pt is a pleasant 63 year old male who was admitted for acute on chronic heart failure. Pt performs bed mobility with max assist +2 for rolling. Unable to further perform mobility at this time due to weakness. Has difficulty rolling due to L UE in splint (confirmed NWB with Dr. Rosita KeaMenz). Pt demonstrates deficits with strength/mobility. Pt is currently not at baseline level. Would benefit from skilled PT to address above deficits and promote optimal return to PLOF; recommend transition to STR upon discharge from acute hospitalization.       Follow Up Recommendations SNF    Equipment Recommendations  None recommended by PT    Recommendations for Other Services       Precautions / Restrictions Precautions Precautions: Fall Restrictions Weight Bearing Restrictions: No      Mobility  Bed Mobility Overal bed mobility: Needs Assistance Bed Mobility: Rolling Rolling: Max assist;+2 for physical assistance         General bed mobility comments: multiple rolling attempts secondary to incontinent episodes. Needs heavy assistance to roll lower body, only able to assist by flexing B knees.   Transfers Overall transfer level: Needs assistance               General transfer comment: unable to perform at this time due to weakness.  Ambulation/Gait                Stairs            Wheelchair Mobility    Modified Rankin (Stroke Patients Only)       Balance Overall balance assessment: Needs assistance;History of Falls                                           Pertinent Vitals/Pain  Pain Assessment: Faces Faces Pain Scale: Hurts a little bit Pain Location: L elbow Pain Descriptors / Indicators: Discomfort Pain Intervention(s): Limited activity within patient's tolerance    Home Living Family/patient expects to be discharged to:: Assisted living     Type of Home: (the [facility]) Home Access: Level entry       Home Equipment: Cane - single point      Prior Function Level of Independence: Independent with assistive device(s)         Comments: reports he usually ambulates without AD, however reports multiple falls.     Hand Dominance        Extremity/Trunk Assessment   Upper Extremity Assessment Upper Extremity Assessment: Generalized weakness(L UE grossly 3/5; not fully tested. R UE grossly 5/5)    Lower Extremity Assessment Lower Extremity Assessment: Generalized weakness(B LE grossly 3/5)       Communication   Communication: No difficulties  Cognition Arousal/Alertness: Awake/alert Behavior During Therapy: WFL for tasks assessed/performed Overall Cognitive Status: Within Functional Limits for tasks assessed                                        General Comments  Exercises Other Exercises Other Exercises: supine ther-ex performed on B LE including ankle pumps, SLRs, heel slides, and hip abd/add. Ther-ex performed x 10 reps with mod assist.   Assessment/Plan    PT Assessment Patient needs continued PT services  PT Problem List Decreased strength;Decreased balance;Decreased mobility;Pain       PT Treatment Interventions Therapeutic exercise;Therapeutic activities;Balance training    PT Goals (Current goals can be found in the Care Plan section)  Acute Rehab PT Goals Patient Stated Goal: to get stronger PT Goal Formulation: With patient Time For Goal Achievement: 12/05/18 Potential to Achieve Goals: Good    Frequency Min 2X/week   Barriers to discharge        Co-evaluation               AM-PAC  PT "6 Clicks" Mobility  Outcome Measure Help needed turning from your back to your side while in a flat bed without using bedrails?: Total Help needed moving from lying on your back to sitting on the side of a flat bed without using bedrails?: Total Help needed moving to and from a bed to a chair (including a wheelchair)?: Total Help needed standing up from a chair using your arms (e.g., wheelchair or bedside chair)?: Total Help needed to walk in hospital room?: Total Help needed climbing 3-5 steps with a railing? : Total 6 Click Score: 6    End of Session Equipment Utilized During Treatment: Oxygen Activity Tolerance: Patient tolerated treatment well Patient left: in bed;with bed alarm set;with nursing/sitter in room Nurse Communication: Mobility status PT Visit Diagnosis: Muscle weakness (generalized) (M62.81);History of falling (Z91.81);Difficulty in walking, not elsewhere classified (R26.2);Pain Pain - Right/Left: Left Pain - part of body: (elbow)    Time: 6808-8110 PT Time Calculation (min) (ACUTE ONLY): 25 min   Charges:   PT Evaluation $PT Eval Low Complexity: 1 Low PT Treatments $Therapeutic Exercise: 8-22 mins        Elizabeth Palau, PT, DPT [phone]   Sukanya Goldblatt 11/21/2018, 4:01 PM

## 2018-11-21 NOTE — Progress Notes (Signed)
[employer] [facility] [place] [facility]                                                                                                                                                                                  Patient Demographics   James Mcbride, is a 63 y.o. male, DOB - Apr 01, 1956, ONG:[id number]  Admit date - 11/18/2018   Admitting Physician Campbell Stall, MD  Outpatient Primary MD for the patient is [name], MD   LOS - 3  Subjective: He is doing better shortness of breath improved Also tolerated BiPAP tonight   CONSTITUTIONAL: No documented fever. No fatigue, weakness. No weight gain, no weight loss.  EYES: No blurry or double vision.  ENT: No tinnitus. No postnasal drip. No redness of the oropharynx.  RESPIRATORY: No cough, no wheeze, no hemoptysis.  Positive dyspnea.  CARDIOVASCULAR: No chest pain. No orthopnea. No palpitations. No syncope.  GASTROINTESTINAL: No nausea, no vomiting or diarrhea. No abdominal pain. No melena or hematochezia.  GENITOURINARY:  No urgency. No frequency. No dysuria. No hematuria. No obstructive symptoms. No discharge. No pain. No significant abnormal bleeding ENDOCRINE: No polyuria or nocturia. No heat or cold intolerance.  HEMATOLOGY: No anemia. No bruising. No bleeding. No purpura. No petechiae INTEGUMENTARY: No rashes. No lesions.  MUSCULOSKELETAL: No arthritis. No swelling. No gout.  NEUROLOGIC: No numbness, tingling, or ataxia. No seizure-type activity.  PSYCHIATRIC: No anxiety. No insomnia. No ADD.      Review of Systems:   CONSTITUTIONAL:     Vitals:   Vitals:   11/21/18 0212 11/21/18 0413 11/21/18 0825 11/21/18 0926  BP:  (!) 160/66  (!) 156/67  Pulse:  66  72  Resp:  20  19  Temp:  98.5 F (36.9 C)    TempSrc:  Oral    SpO2: 94% 99% 98% 97%  Weight:  (!) 167.2 kg    Height:        Wt Readings from Last 3 Encounters:  11/21/18 (!) 167.2 kg  10/12/17 (!) 165.6 kg  07/18/16 (!) 159.7 kg      Intake/Output Summary (Last 24 hours) at 11/21/2018 1254 Last data filed at 11/21/2018 0500 Gross per 24 hour  Intake 108.02 ml  Output 5400 ml  Net -5291.98 ml    Physical Exam:   GENERAL: Morbidly obese male ill-appearing HEAD, EYES, EARS, NOSE AND THROAT: Atraumatic, normocephalic. Extraocular muscles are intact. Pupils equal and reactive to light. Sclerae anicteric. No conjunctival injection. No oro-pharyngeal erythema.  NECK: Supple. There is no jugular venous distention. No bruits, no lymphadenopathy, no thyromegaly.  HEART: Regular rate and rhythm,. No murmurs, no rubs, no clicks.  LUNGS:  Limited breath sounds due to his obese body habitus ABDOMEN: Soft, flat, nontender, nondistended. Has good bowel sounds. No hepatosplenomegaly appreciated.  EXTREMITIES: Positive edema  nEUROLOGIC: Patient very drowsy SKIN: Moist and warm with no rashes appreciated.  Psych: Not anxious, depressed LN: No inguinal LN enlargement    Antibiotics   Anti-infectives (From admission, onward)   None      Medications   Scheduled Meds: . aspirin  81 mg Oral Daily  . atorvastatin  10 mg Oral Daily  . enoxaparin (LOVENOX) injection  40 mg Subcutaneous Q12H  . fluticasone  1 spray Each Nare BID  . furosemide  60 mg Intravenous BID  . ipratropium-albuterol  3 mL Nebulization Q6H  . miconazole  1 application Topical BID  . PHENObarbital  65 mg Intravenous QHS  . sodium chloride flush  3 mL Intravenous Q12H   Continuous Infusions: . sodium chloride    . levETIRAcetam 750 mg (11/20/18 2301)   PRN Meds:.sodium chloride, acetaminophen, ondansetron (ZOFRAN) IV, sodium chloride, sodium chloride flush   Data Review:   Micro Results Recent Results (from the past 240 hour(s))  SARS Coronavirus 2 (CEPHEID - Performed in [facility] hospital lab), Hosp Order     Status: None   Collection Time: 11/18/18 12:00 PM  Result Value Ref Range Status   SARS Coronavirus 2 NEGATIVE NEGATIVE Final     Comment: (NOTE) If result is NEGATIVE SARS-CoV-2 target nucleic acids are NOT DETECTED. The SARS-CoV-2 RNA is generally detectable in upper and lower  respiratory specimens during the acute phase of infection. The lowest  concentration of SARS-CoV-2 viral copies this assay can detect is 250  copies / mL. A negative result does not preclude SARS-CoV-2 infection  and should not be used as the sole basis for treatment or other  patient management decisions.  A negative result may occur with  improper specimen collection / handling, submission of specimen other  than nasopharyngeal swab, presence of viral mutation(s) within the  areas targeted by this assay, and inadequate number of viral copies  (<250 copies / mL). A negative result must be combined with clinical  observations, patient history, and epidemiological information. If result is POSITIVE SARS-CoV-2 target nucleic acids are DETECTED. The SARS-CoV-2 RNA is generally detectable in upper and lower  respiratory specimens dur ing the acute phase of infection.  Positive  results are indicative of active infection with SARS-CoV-2.  Clinical  correlation with patient history and other diagnostic information is  necessary to determine patient infection status.  Positive results do  not rule out bacterial infection or co-infection with other viruses. If result is PRESUMPTIVE POSTIVE SARS-CoV-2 nucleic acids MAY BE PRESENT.   A presumptive positive result was obtained on the submitted specimen  and confirmed on repeat testing.  While 2019 novel coronavirus  (SARS-CoV-2) nucleic acids may be present in the submitted sample  additional confirmatory testing may be necessary for epidemiological  and / or clinical management purposes  to differentiate between  SARS-CoV-2 and other Sarbecovirus currently known to infect humans.  If clinically indicated additional testing with an alternate test  methodology [phone]) is advised. The SARS-CoV-2  RNA is generally  detectable in upper and lower respiratory sp ecimens during the acute  phase of infection. The expected result is Negative. Fact Sheet for Patients:  BoilerBrush.com.cy Fact Sheet for Healthcare Providers: https://pope.com/ This test is not yet approved or cleared by the [place] FDA and has been authorized for detection and/or diagnosis of SARS-CoV-2 by  FDA under an Emergency Use Authorization (EUA).  This EUA will remain in effect (meaning this test can be used) for the duration of the COVID-19 declaration under Section 564(b)(1) of the Act, 21 U.S.C. section 360bbb-3(b)(1), unless the authorization is terminated or revoked sooner. Performed at [facility], [address] [place], [place] [address]   MRSA PCR Screening     Status: None   Collection Time: 11/19/18  1:50 PM  Result Value Ref Range Status   MRSA by PCR NEGATIVE NEGATIVE Final    Comment:        The GeneXpert MRSA Assay (FDA approved for NASAL specimens only), is one component of a comprehensive MRSA colonization surveillance program. It is not intended to diagnose MRSA infection nor to guide or monitor treatment for MRSA infections. Performed at [facility], [address], [place], [place] [address]     Radiology Reports Dg Pelvis 1-2 Views  Result Date: 11/18/2018 CLINICAL DATA:  Pelvic pain. EXAM: PELVIS - 1-2 VIEW COMPARISON:  None. FINDINGS: There is no evidence of pelvic fracture or diastasis. No pelvic bone lesions are seen. IMPRESSION: Negative. Electronically Signed   By: Lupita Raider M.D.   On: 11/18/2018 09:36   Dg Elbow 2 Views Left  Result Date: 11/18/2018 CLINICAL DATA:  Status post closed reduction of elbow dislocation. EXAM: LEFT ELBOW - 1 VIEW COMPARISON:  11/18/2018 FINDINGS: A single lateral radiograph demonstrates interval reduction of the previously seen dislocations of the radius and ulna.  Olecranon spurring is noted. IMPRESSION: Interval reduction of elbow dislocation. Electronically Signed   By: Sebastian Ache M.D.   On: 11/18/2018 11:28   Dg Elbow Complete Left  Result Date: 11/18/2018 CLINICAL DATA:  Left elbow pain after fall five days ago. EXAM: LEFT ELBOW - COMPLETE 3+ VIEW COMPARISON:  Radiographs of Nov 13, 2018. FINDINGS: There is posterior dislocation of the proximal radius and ulna. Mild olecranon spurring is noted. Several well-corticated bone densities are again noted. No definite fracture is noted. IMPRESSION: Posterior dislocation of proximal radius and ulna relative to distal left humerus. No definite fracture is noted. Electronically Signed   By: Lupita Raider M.D.   On: 11/18/2018 09:31   Dg Elbow Complete Left  Result Date: 11/13/2018 CLINICAL DATA:  Recent slip and fall with left elbow pain, initial encounter EXAM: LEFT ELBOW - COMPLETE 3+ VIEW COMPARISON:  None. FINDINGS: Olecranon spurring is noted. Small bony densities are noted adjacent to the articulation of the humerus with both the radius and ulna medially and laterally. No definitive donor sites are seen in these may be chronic in nature. Generalized soft tissue swelling is noted about elbow joint. Olecranon spur is noted. Elevation of the fat pads is noted consistent with acute joint effusion. IMPRESSION: Soft tissue changes as described. Bony densities adjacent to the elbow joint which appear well corticated and no donor sites are seen to suggest acute fracture. Nonemergent MRI may be helpful if symptomatology persists. Electronically Signed   By: Alcide Clever M.D.   On: 11/13/2018 22:10   Dg Wrist Complete Left  Result Date: 11/18/2018 CLINICAL DATA:  Left wrist pain after fall 5 days ago EXAM: LEFT WRIST - COMPLETE 3+ VIEW COMPARISON:  None. FINDINGS: There is no evidence of fracture or dislocation. There is no evidence of arthropathy or other focal bone abnormality. Soft tissues are unremarkable.  IMPRESSION: Negative. Electronically Signed   By: Lupita Raider M.D.   On: 11/18/2018 09:33  Ct Angio Chest Pe W Or Wo Contrast  Result Date: 11/19/2018 CLINICAL DATA:  Morbid obesity.  Abnormal chest radiograph EXAM: CT ANGIOGRAPHY CHEST WITH CONTRAST TECHNIQUE: Multidetector CT imaging of the chest was performed using the standard protocol during bolus administration of intravenous contrast. Multiplanar CT image reconstructions and MIPs were obtained to evaluate the vascular anatomy. CONTRAST:  75mL OMNIPAQUE IOHEXOL 350 MG/ML SOLN COMPARISON:  Radiograph 11/18/2018 FINDINGS: Cardiovascular:  Heart is enlarged.  No pericardial fluid. Exam is limited by respiratory motion and body habitus. No filling defects evident within the pulmonary arteries identified. The distal lower lobe pulmonary arteries are difficult to evaluate. Mediastinum/Nodes: No axillary supraclavicular adenopathy. No mediastinal and Lungs/Pleura: There is bibasilar linear peribronchial thickening and atelectasis within the medial LEFT and RIGHT lower lobe. No air bronchograms. Upper lungs relatively clear. Upper Abdomen: No acute abnormality. Musculoskeletal: No chest wall abnormality. No acute or significant osseous findings. Review of the MIP images confirms the above findings. IMPRESSION: 1. Suboptimal exam due to patient respiratory motion and large body habitus. With this caveat, no evidence of acute pulmonary embolism. 2. Severe medial bibasilar atelectasis. Potential superimposed pneumonia or aspiration pneumonitis. Electronically Signed   By: Genevive Bi M.D.   On: 11/19/2018 13:49   US Venous Img Lower Unilateral Left  Result Date: 11/18/2018 CLINICAL DATA:  Left lower extremity pain and edema after suffering a fall this Sunday. Evaluate for DVT. EXAM: LEFT LOWER EXTREMITY VENOUS DOPPLER ULTRASOUND TECHNIQUE: Gray-scale sonography with graded compression, as well as color Doppler and duplex ultrasound were performed to  evaluate the lower extremity deep venous systems from the level of the common femoral vein and including the common femoral, femoral, profunda femoral, popliteal and calf veins including the posterior tibial, peroneal and gastrocnemius veins when visible. The superficial great saphenous vein was also interrogated. Spectral Doppler was utilized to evaluate flow at rest and with distal augmentation maneuvers in the common femoral, femoral and popliteal veins. COMPARISON:  None. FINDINGS: Examination is degraded due to patient body habitus. Contralateral Common Femoral Vein: Respiratory phasicity is normal and symmetric with the symptomatic side. No evidence of thrombus. Normal compressibility. Common Femoral Vein: No evidence of thrombus. Normal compressibility, respiratory phasicity and response to augmentation. Saphenofemoral Junction: No evidence of thrombus. Normal compressibility and flow on color Doppler imaging. Profunda Femoral Vein: No evidence of thrombus. Normal compressibility and flow on color Doppler imaging. Femoral Vein: No evidence of thrombus. Normal compressibility, respiratory phasicity and response to augmentation. Popliteal Vein: No evidence of thrombus. Normal compressibility, respiratory phasicity and response to augmentation. Calf Veins: Not well visualized. Superficial Great Saphenous Vein: No evidence of thrombus. Normal compressibility. Venous Reflux:  None. Other Findings: There is a minimal amount of subcutaneous edema at the level of the left calf (images 34 and 35). IMPRESSION: No evidence of DVT within the left lower extremity. Electronically Signed   By: Simonne Come M.D.   On: 11/18/2018 11:15   Dg Chest Portable 1 View  Result Date: 11/18/2018 CLINICAL DATA:  Decreased oxygen saturation.  Hypertension. EXAM: PORTABLE CHEST 1 VIEW COMPARISON:  October 12, 2017 FINDINGS: There is cardiomegaly with pulmonary venous hypertension. There is no appreciable interstitial edema. There is no  airspace consolidation or pleural effusion appreciable. No adenopathy. No bone lesions. IMPRESSION: Persistent pulmonary vascular congestion without overt edema or consolidation. No adenopathy evident. Electronically Signed   By: Bretta Bang III M.D.   On: 11/18/2018 10:25   Dg Femur Min 2 Views Left  Result Date:  11/18/2018 CLINICAL DATA:  Left leg pain after fall 5 days ago. EXAM: LEFT FEMUR 2 VIEWS COMPARISON:  None. FINDINGS: There is no evidence of fracture or other focal bone lesions. Soft tissues are unremarkable. IMPRESSION: Negative. Electronically Signed   By: Lupita Raider M.D.   On: 11/18/2018 09:34     CBC Recent Labs  Lab 11/18/18 0837 11/19/18 0517  WBC 5.4 4.6  HGB 10.1* 10.2*  HCT 31.8* 34.6*  PLT 130* 114*  MCV 83.9 89.4  MCH 26.6 26.4  MCHC 31.8 29.5*  RDW 15.5 15.5  LYMPHSABS 1.8  --   MONOABS 0.5  --   EOSABS 0.1  --   BASOSABS 0.0  --     Chemistries  Recent Labs  Lab 11/18/18 0837 11/19/18 0517 11/20/18 0512 11/21/18 0455  NA 138 140 142 142  K 4.0 4.9 4.8 4.5  CL 105 106 106 105  CO2 GLUCOSE 127* 100* 70 82  BUN 18 24* 27* 27*  CREATININE 0.89 1.07 1.08 0.99  CALCIUM 10.6* 10.6* 11.0* 10.7*  MG  --   --  2.2 2.0  AST 18  --   --   --   ALT 16  --   --   --   ALKPHOS 112  --   --   --   BILITOT 0.4  --   --   --    ------------------------------------------------------------------------------------------------------------------ estimated creatinine clearance is 120.9 mL/min (by C-G formula based on SCr of 0.99 mg/dL). ------------------------------------------------------------------------------------------------------------------ Recent Labs    11/19/18 0517  HGBA1C 5.5   ------------------------------------------------------------------------------------------------------------------ No results for input(s): CHOL, HDL, LDLCALC, TRIG, CHOLHDL, LDLDIRECT in the last 72  hours. ------------------------------------------------------------------------------------------------------------------ No results for input(s): TSH, T4TOTAL, T3FREE, THYROIDAB in the last 72 hours.  Invalid input(s): FREET3 ------------------------------------------------------------------------------------------------------------------ Recent Labs    11/19/18 0517 11/19/18 0518  VITAMINB12  --  202  FOLATE 9.4  --   FERRITIN 37  --   TIBC 291  --   IRON 26*  --     Coagulation profile No results for input(s): INR, PROTIME in the last 168 hours.  No results for input(s): DDIMER in the last 72 hours.  Cardiac Enzymes No results for input(s): CKMB, TROPONINI, MYOGLOBIN in the last 168 hours.  Invalid input(s): CK ------------------------------------------------------------------------------------------------------------------ Invalid input(s): POCBNP    Assessment & Plan  Patient is 63 year old with likely severe sleep apnea  Acute hypercarbic respiratory failure Continue BiPAP at nighttime Likely related to obesity hypoventilation syndrome and untreated sleep apnea CT chest shows no PE Likely atelectasis  Acute on chronic diastolic congestive heart failure-  Echo shows normal EF likely has severe diastolic dysfunction Patient still with significant fluid we will place him on IV Lasix drip   Left elbow dislocation- due to mechanical fall -Left elbow was reduced by Dr. Rosita Kea  -Needs to follow-up with Dr. Rosita Kea as an outpatient  Hypertension- we will resume some of his antihypertensive  History of stroke-  -Continue aspirin and Lipitor  History of seizures-no seizure-like activity recently -Continue home phenobarbital and Keppra  Normocytic anemia- no active bleeding  Hyperglycemia -Hemoglobin A1c 5.5 not a diabetic  OSA -Patient has been noncompliant with CPAP       Code Status Orders  (From admission, onward)         Start     Ordered    11/18/18 1457  Full code  Continuous     11/18/18 1456  Code Status History    Date Active Date Inactive Code Status Order ID Comments User Context   10/27/2014 2202 10/28/2014 2200 Full Code [id number]  Enid BaasKalisetti, Radhika, MD Inpatient       I discussed with patient's sister regarding overall poor prognosis    Consults pulmonary critical care   DVT Prophylaxis  Lovenox  Lab Results  Component Value Date   PLT 114 (L) 11/19/2018     Time Spent in minutes 35-minute greater than 50% of time spent in care coordination and counseling patient regarding the condition and plan of care.   Auburn BilberryShreyang Ricco Dershem M.D on 11/21/2018 at 12:54 PM  Between 7am to 6pm - Pager - [phone]  After 6pm go to [email or web address] - Social research officer, governmentpassword [email or web address]  [employer]   Office  [phone]

## 2018-11-22 ENCOUNTER — Ambulatory Visit: Payer: Medicaid Other | Admitting: Family

## 2018-11-22 LAB — BASIC METABOLIC PANEL
Anion gap: 7 (ref 5–15)
BUN: 24 mg/dL — ABNORMAL HIGH (ref 8–23)
CO2: 29 mmol/L (ref 22–32)
Calcium: 10.5 mg/dL — ABNORMAL HIGH (ref 8.9–10.3)
Chloride: 105 mmol/L (ref 98–111)
Creatinine, Ser: 1.08 mg/dL (ref 0.61–1.24)
GFR calc Af Amer: >60 mL/min (ref >60)
GFR calc non Af Amer: >60 mL/min (ref >60)
Glucose, Bld: 87 mg/dL (ref 70–99)
Potassium: 4.7 mmol/L (ref 3.5–5.1)
Sodium: 141 mmol/L (ref 135–145)

## 2018-11-22 LAB — HIV ANTIBODY (ROUTINE TESTING W REFLEX): HIV Screen 4th Generation wRfx: NONREACTIVE

## 2018-11-22 MED ORDER — FUROSEMIDE 10 MG/ML IJ SOLN
60.0000 mg | Freq: Two times a day (BID) | INTRAMUSCULAR | Status: DC
  Administered 2018-11-22 (×2): 60 mg via INTRAVENOUS
  Filled 2018-11-22 (×2): qty 6

## 2018-11-22 MED ORDER — HYDRALAZINE HCL 50 MG PO TABS
100.0000 mg | ORAL_TABLET | Freq: Three times a day (TID) | ORAL | Status: DC
  Administered 2018-11-22 – 2018-11-24 (×7): 100 mg via ORAL
  Filled 2018-11-22 (×7): qty 2

## 2018-11-22 MED ORDER — CARVEDILOL 25 MG PO TABS
25.0000 mg | ORAL_TABLET | Freq: Two times a day (BID) | ORAL | Status: DC
  Administered 2018-11-22 – 2018-11-24 (×4): 25 mg via ORAL
  Filled 2018-11-22 (×4): qty 1

## 2018-11-22 NOTE — Progress Notes (Signed)
[employer] - [place] at [facility]                                                                                                                                                                                  Patient Demographics   James Mcbride, is a 63 y.o. male, DOB - Dec 10, 1955, VHQ:[id number]  Admit date - 11/18/2018   Admitting Physician Campbell Stall, MD  Outpatient Primary MD for the patient is [name], MD   LOS - 4  Subjective: States feeling little better  CONSTITUTIONAL: No documented fever. No fatigue, weakness. No weight gain, no weight loss.  EYES: No blurry or double vision.  ENT: No tinnitus. No postnasal drip. No redness of the oropharynx.  RESPIRATORY: No cough, no wheeze, no hemoptysis.  Positive dyspnea.  CARDIOVASCULAR: No chest pain. No orthopnea. No palpitations. No syncope.  GASTROINTESTINAL: No nausea, no vomiting or diarrhea. No abdominal pain. No melena or hematochezia.  GENITOURINARY:  No urgency. No frequency. No dysuria. No hematuria. No obstructive symptoms. No discharge. No pain. No significant abnormal bleeding ENDOCRINE: No polyuria or nocturia. No heat or cold intolerance.  HEMATOLOGY: No anemia. No bruising. No bleeding. No purpura. No petechiae INTEGUMENTARY: No rashes. No lesions.  MUSCULOSKELETAL: No arthritis. No swelling. No gout.  NEUROLOGIC: No numbness, tingling, or ataxia. No seizure-type activity.  PSYCHIATRIC: No anxiety. No insomnia. No ADD.      Review of Systems:   CONSTITUTIONAL:     Vitals:   Vitals:   11/22/18 0332 11/22/18 0728 11/22/18 0900 11/22/18 1042  BP:  (!) 169/79    Pulse:  (!) 57    Resp:    16  Temp:  98.6 F (37 C)    TempSrc:  Oral    SpO2:  98% 94%   Weight: (!) 166.5 kg     Height:        Wt Readings from Last 3 Encounters:  11/22/18 (!) 166.5 kg  10/12/17 (!) 165.6 kg  07/18/16 (!) 159.7 kg     Intake/Output Summary (Last 24 hours) at 11/22/2018 1317 Last  data filed at 11/22/2018 0300 Gross per 24 hour  Intake 480 ml  Output 0 ml  Net 480 ml    Physical Exam:   GENERAL: Morbidly obese male ill-appearing HEAD, EYES, EARS, NOSE AND THROAT: Atraumatic, normocephalic. Extraocular muscles are intact. Pupils equal and reactive to light. Sclerae anicteric. No conjunctival injection. No oro-pharyngeal erythema.  NECK: Supple. There is no jugular venous distention. No bruits, no lymphadenopathy, no thyromegaly.  HEART: Regular rate and rhythm,. No murmurs, no rubs, no clicks.  LUNGS: Limited breath sounds due to his obese body habitus  ABDOMEN: Soft, flat, nontender, nondistended. Has good bowel sounds. No hepatosplenomegaly appreciated.  EXTREMITIES: Positive edema  nEUROLOGIC: Patient very drowsy SKIN: Moist and warm with no rashes appreciated.  Psych: Not anxious, depressed LN: No inguinal LN enlargement    Antibiotics   Anti-infectives (From admission, onward)   None      Medications   Scheduled Meds: . aspirin  81 mg Oral Daily  . atorvastatin  10 mg Oral Daily  . carvedilol  25 mg Oral BID WC  . enoxaparin (LOVENOX) injection  40 mg Subcutaneous Q12H  . fluticasone  1 spray Each Nare BID  . furosemide  60 mg Intravenous Q12H  . hydrALAZINE  100 mg Oral Q8H  . ipratropium-albuterol  3 mL Nebulization TID  . isosorbide mononitrate  30 mg Oral Daily  . miconazole  1 application Topical BID  . PHENObarbital  65 mg Intravenous QHS  . sodium chloride flush  3 mL Intravenous Q12H   Continuous Infusions: . sodium chloride Stopped (11/22/18 1234)  . levETIRAcetam Stopped (11/22/18 1210)   PRN Meds:.sodium chloride, acetaminophen, ondansetron (ZOFRAN) IV, sodium chloride, sodium chloride flush   Data Review:   Micro Results Recent Results (from the past 240 hour(s))  SARS Coronavirus 2 (CEPHEID - Performed in [facility] hospital lab), Hosp Order     Status: None   Collection Time: 11/18/18 12:00 PM  Result Value Ref Range  Status   SARS Coronavirus 2 NEGATIVE NEGATIVE Final    Comment: (NOTE) If result is NEGATIVE SARS-CoV-2 target nucleic acids are NOT DETECTED. The SARS-CoV-2 RNA is generally detectable in upper and lower  respiratory specimens during the acute phase of infection. The lowest  concentration of SARS-CoV-2 viral copies this assay can detect is 250  copies / mL. A negative result does not preclude SARS-CoV-2 infection  and should not be used as the sole basis for treatment or other  patient management decisions.  A negative result may occur with  improper specimen collection / handling, submission of specimen other  than nasopharyngeal swab, presence of viral mutation(s) within the  areas targeted by this assay, and inadequate number of viral copies  (<250 copies / mL). A negative result must be combined with clinical  observations, patient history, and epidemiological information. If result is POSITIVE SARS-CoV-2 target nucleic acids are DETECTED. The SARS-CoV-2 RNA is generally detectable in upper and lower  respiratory specimens dur ing the acute phase of infection.  Positive  results are indicative of active infection with SARS-CoV-2.  Clinical  correlation with patient history and other diagnostic information is  necessary to determine patient infection status.  Positive results do  not rule out bacterial infection or co-infection with other viruses. If result is PRESUMPTIVE POSTIVE SARS-CoV-2 nucleic acids MAY BE PRESENT.   A presumptive positive result was obtained on the submitted specimen  and confirmed on repeat testing.  While 2019 novel coronavirus  (SARS-CoV-2) nucleic acids may be present in the submitted sample  additional confirmatory testing may be necessary for epidemiological  and / or clinical management purposes  to differentiate between  SARS-CoV-2 and other Sarbecovirus currently known to infect humans.  If clinically indicated additional testing with an alternate  test  methodology [phone]) is advised. The SARS-CoV-2 RNA is generally  detectable in upper and lower respiratory sp ecimens during the acute  phase of infection. The expected result is Negative. Fact Sheet for Patients:  BoilerBrush.com.cy Fact Sheet for Healthcare Providers: https://pope.com/ This test is not yet approved or  cleared by the [place] FDA and has been authorized for detection and/or diagnosis of SARS-CoV-2 by FDA under an Emergency Use Authorization (EUA).  This EUA will remain in effect (meaning this test can be used) for the duration of the COVID-19 declaration under Section 564(b)(1) of the Act, 21 U.S.C. section 360bbb-3(b)(1), unless the authorization is terminated or revoked sooner. Performed at [facility], [address] [place], [place] [address]   MRSA PCR Screening     Status: None   Collection Time: 11/19/18  1:50 PM  Result Value Ref Range Status   MRSA by PCR NEGATIVE NEGATIVE Final    Comment:        The GeneXpert MRSA Assay (FDA approved for NASAL specimens only), is one component of a comprehensive MRSA colonization surveillance program. It is not intended to diagnose MRSA infection nor to guide or monitor treatment for MRSA infections. Performed at [facility], [address] [place], [place] [address]     Radiology Reports Dg Pelvis 1-2 Views  Result Date: 11/18/2018 CLINICAL DATA:  Pelvic pain. EXAM: PELVIS - 1-2 VIEW COMPARISON:  None. FINDINGS: There is no evidence of pelvic fracture or diastasis. No pelvic bone lesions are seen. IMPRESSION: Negative. Electronically Signed   By: Lupita RaiderJames  Green Jr M.D.   On: 11/18/2018 09:36   Dg Elbow 2 Views Left  Result Date: 11/18/2018 CLINICAL DATA:  Status post closed reduction of elbow dislocation. EXAM: LEFT ELBOW - 1 VIEW COMPARISON:  11/18/2018 FINDINGS: A single lateral radiograph demonstrates interval reduction of the  previously seen dislocations of the radius and ulna. Olecranon spurring is noted. IMPRESSION: Interval reduction of elbow dislocation. Electronically Signed   By: Sebastian AcheAllen  Grady M.D.   On: 11/18/2018 11:28   Dg Elbow Complete Left  Result Date: 11/18/2018 CLINICAL DATA:  Left elbow pain after fall five days ago. EXAM: LEFT ELBOW - COMPLETE 3+ VIEW COMPARISON:  Radiographs of Nov 13, 2018. FINDINGS: There is posterior dislocation of the proximal radius and ulna. Mild olecranon spurring is noted. Several well-corticated bone densities are again noted. No definite fracture is noted. IMPRESSION: Posterior dislocation of proximal radius and ulna relative to distal left humerus. No definite fracture is noted. Electronically Signed   By: Lupita RaiderJames  Green Jr M.D.   On: 11/18/2018 09:31   Dg Elbow Complete Left  Result Date: 11/13/2018 CLINICAL DATA:  Recent slip and fall with left elbow pain, initial encounter EXAM: LEFT ELBOW - COMPLETE 3+ VIEW COMPARISON:  None. FINDINGS: Olecranon spurring is noted. Small bony densities are noted adjacent to the articulation of the humerus with both the radius and ulna medially and laterally. No definitive donor sites are seen in these may be chronic in nature. Generalized soft tissue swelling is noted about elbow joint. Olecranon spur is noted. Elevation of the fat pads is noted consistent with acute joint effusion. IMPRESSION: Soft tissue changes as described. Bony densities adjacent to the elbow joint which appear well corticated and no donor sites are seen to suggest acute fracture. Nonemergent MRI may be helpful if symptomatology persists. Electronically Signed   By: Alcide CleverMark  Lukens M.D.   On: 11/13/2018 22:10   Dg Wrist Complete Left  Result Date: 11/18/2018 CLINICAL DATA:  Left wrist pain after fall 5 days ago EXAM: LEFT WRIST - COMPLETE 3+ VIEW COMPARISON:  None. FINDINGS: There is no evidence of fracture or dislocation. There is no evidence of arthropathy or other focal bone  abnormality. Soft tissues are unremarkable. IMPRESSION: Negative.  Electronically Signed   By: Lupita Raider M.D.   On: 11/18/2018 09:33   Ct Angio Chest Pe W Or Wo Contrast  Result Date: 11/19/2018 CLINICAL DATA:  Morbid obesity.  Abnormal chest radiograph EXAM: CT ANGIOGRAPHY CHEST WITH CONTRAST TECHNIQUE: Multidetector CT imaging of the chest was performed using the standard protocol during bolus administration of intravenous contrast. Multiplanar CT image reconstructions and MIPs were obtained to evaluate the vascular anatomy. CONTRAST:  75mL OMNIPAQUE IOHEXOL 350 MG/ML SOLN COMPARISON:  Radiograph 11/18/2018 FINDINGS: Cardiovascular:  Heart is enlarged.  No pericardial fluid. Exam is limited by respiratory motion and body habitus. No filling defects evident within the pulmonary arteries identified. The distal lower lobe pulmonary arteries are difficult to evaluate. Mediastinum/Nodes: No axillary supraclavicular adenopathy. No mediastinal and Lungs/Pleura: There is bibasilar linear peribronchial thickening and atelectasis within the medial LEFT and RIGHT lower lobe. No air bronchograms. Upper lungs relatively clear. Upper Abdomen: No acute abnormality. Musculoskeletal: No chest wall abnormality. No acute or significant osseous findings. Review of the MIP images confirms the above findings. IMPRESSION: 1. Suboptimal exam due to patient respiratory motion and large body habitus. With this caveat, no evidence of acute pulmonary embolism. 2. Severe medial bibasilar atelectasis. Potential superimposed pneumonia or aspiration pneumonitis. Electronically Signed   By: Genevive Bi M.D.   On: 11/19/2018 13:49   US Venous Img Lower Unilateral Left  Result Date: 11/18/2018 CLINICAL DATA:  Left lower extremity pain and edema after suffering a fall this Sunday. Evaluate for DVT. EXAM: LEFT LOWER EXTREMITY VENOUS DOPPLER ULTRASOUND TECHNIQUE: Gray-scale sonography with graded compression, as well as color Doppler  and duplex ultrasound were performed to evaluate the lower extremity deep venous systems from the level of the common femoral vein and including the common femoral, femoral, profunda femoral, popliteal and calf veins including the posterior tibial, peroneal and gastrocnemius veins when visible. The superficial great saphenous vein was also interrogated. Spectral Doppler was utilized to evaluate flow at rest and with distal augmentation maneuvers in the common femoral, femoral and popliteal veins. COMPARISON:  None. FINDINGS: Examination is degraded due to patient body habitus. Contralateral Common Femoral Vein: Respiratory phasicity is normal and symmetric with the symptomatic side. No evidence of thrombus. Normal compressibility. Common Femoral Vein: No evidence of thrombus. Normal compressibility, respiratory phasicity and response to augmentation. Saphenofemoral Junction: No evidence of thrombus. Normal compressibility and flow on color Doppler imaging. Profunda Femoral Vein: No evidence of thrombus. Normal compressibility and flow on color Doppler imaging. Femoral Vein: No evidence of thrombus. Normal compressibility, respiratory phasicity and response to augmentation. Popliteal Vein: No evidence of thrombus. Normal compressibility, respiratory phasicity and response to augmentation. Calf Veins: Not well visualized. Superficial Great Saphenous Vein: No evidence of thrombus. Normal compressibility. Venous Reflux:  None. Other Findings: There is a minimal amount of subcutaneous edema at the level of the left calf (images 34 and 35). IMPRESSION: No evidence of DVT within the left lower extremity. Electronically Signed   By: Simonne Come M.D.   On: 11/18/2018 11:15   Dg Chest Portable 1 View  Result Date: 11/18/2018 CLINICAL DATA:  Decreased oxygen saturation.  Hypertension. EXAM: PORTABLE CHEST 1 VIEW COMPARISON:  October 12, 2017 FINDINGS: There is cardiomegaly with pulmonary venous hypertension. There is no  appreciable interstitial edema. There is no airspace consolidation or pleural effusion appreciable. No adenopathy. No bone lesions. IMPRESSION: Persistent pulmonary vascular congestion without overt edema or consolidation. No adenopathy evident. Electronically Signed   By: Bretta Bang III  M.D.   On: 11/18/2018 10:25   Dg Femur Min 2 Views Left  Result Date: 11/18/2018 CLINICAL DATA:  Left leg pain after fall 5 days ago. EXAM: LEFT FEMUR 2 VIEWS COMPARISON:  None. FINDINGS: There is no evidence of fracture or other focal bone lesions. Soft tissues are unremarkable. IMPRESSION: Negative. Electronically Signed   By: Lupita Raider M.D.   On: 11/18/2018 09:34     CBC Recent Labs  Lab 11/18/18 0837 11/19/18 0517  WBC 5.4 4.6  HGB 10.1* 10.2*  HCT 31.8* 34.6*  PLT 130* 114*  MCV 83.9 89.4  MCH 26.6 26.4  MCHC 31.8 29.5*  RDW 15.5 15.5  LYMPHSABS 1.8  --   MONOABS 0.5  --   EOSABS 0.1  --   BASOSABS 0.0  --     Chemistries  Recent Labs  Lab 11/18/18 0837 11/19/18 0517 11/20/18 0512 11/21/18 0455 11/22/18 0410  NA 138 140 142 142 141  K 4.0 4.9 4.8 4.5 4.7  CL 105 106 106 105 105  CO2 GLUCOSE 127* 100* 70 82 87  BUN 18 24* 27* 27* 24*  CREATININE 0.89 1.07 1.08 0.99 1.08  CALCIUM 10.6* 10.6* 11.0* 10.7* 10.5*  MG  --   --  2.2 2.0  --   AST 18  --   --   --   --   ALT 16  --   --   --   --   ALKPHOS 112  --   --   --   --   BILITOT 0.4  --   --   --   --    ------------------------------------------------------------------------------------------------------------------ estimated creatinine clearance is 110.6 mL/min (by C-G formula based on SCr of 1.08 mg/dL). ------------------------------------------------------------------------------------------------------------------ No results for input(s): HGBA1C in the last 72 hours. ------------------------------------------------------------------------------------------------------------------ No results  for input(s): CHOL, HDL, LDLCALC, TRIG, CHOLHDL, LDLDIRECT in the last 72 hours. ------------------------------------------------------------------------------------------------------------------ No results for input(s): TSH, T4TOTAL, T3FREE, THYROIDAB in the last 72 hours.  Invalid input(s): FREET3 ------------------------------------------------------------------------------------------------------------------ No results for input(s): VITAMINB12, FOLATE, FERRITIN, TIBC, IRON, RETICCTPCT in the last 72 hours.  Coagulation profile No results for input(s): INR, PROTIME in the last 168 hours.  No results for input(s): DDIMER in the last 72 hours.  Cardiac Enzymes No results for input(s): CKMB, TROPONINI, MYOGLOBIN in the last 168 hours.  Invalid input(s): CK ------------------------------------------------------------------------------------------------------------------ Invalid input(s): POCBNP    Assessment & Plan  Patient is 63 year old with likely severe sleep apnea  Acute hypercarbic respiratory failure Continue BiPAP at nighttime Likely related to obesity hypoventilation syndrome and untreated sleep apnea CT chest shows no PE Likely atelectasis Patient not willing to participate too much she wanted nurse to feed her.    Acute on chronic diastolic congestive heart failure-  Echo shows normal EF likely has severe diastolic dysfunction Resume IV Lasix   Left elbow dislocation- due to mechanical fall -Left elbow was reduced by Dr. Rosita Kea  -Needs to follow-up with Dr. Rosita Kea as an outpatient  Hypertension- we will resume some of his antihypertensive  History of stroke-  -Continue aspirin and Lipitor  History of seizures-no seizure-like activity recently -Continue home phenobarbital and Keppra  Normocytic anemia- no active bleeding  Hyperglycemia -Hemoglobin A1c 5.5 not a diabetic  OSA -Patient has been noncompliant with CPAP       Code Status Orders   (From admission, onward)         Start     Ordered  11/18/18 1457  Full code  Continuous     11/18/18 1456        Code Status History    Date Active Date Inactive Code Status Order ID Comments User Context   10/27/2014 2202 10/28/2014 2200 Full Code [id number]  Enid Baas, MD Inpatient       I discussed with patient's sister regarding overall poor prognosis    Consults pulmonary critical care   DVT Prophylaxis  Lovenox  Lab Results  Component Value Date   PLT 114 (L) 11/19/2018     Time Spent in minutes 35-minute greater than 50% of time spent in care coordination and counseling patient regarding the condition and plan of care.   Auburn Bilberry M.D on 11/22/2018 at 1:17 PM  Between 7am to 6pm - Pager - [phone]  After 6pm go to [email or web address] - Social research officer, government  [employer]   Office  [phone]

## 2018-11-22 NOTE — TOC Initial Note (Signed)
Transition of Care [facility]) - Initial/Assessment Note    Patient Details  Name: James Mcbride MRN: [id number] Date of Birth: [birth year]  [facility] CM/SW Contact:    Sherren Kerns, RN Phone Number: 11/22/2018, 2:57 PM  Clinical Narrative:    Patient is from [facility].  He fell 1 week ago.  Admitted with acute on chronic CHF.  Spoke with sister regina as patient is asleep [phone].  He is current with his PCP; obtains medications without difficulty.  Currently not ambulatory.  PT has recommended SNF.  Patient has special assistance Medicaid.  Sent out bed requests via the hub.  Patient has been to [facility] before.  Sister [name] is okay with that if they can eventually transfer to [facility] once Medicaid is changed to LTC Medicaid.   Will continue to follow.             Expected Discharge Plan: Skilled Nursing Facility Barriers to Discharge: Continued Medical Work up   Patient Goals and CMS Choice Patient states their goals for this hospitalization and ongoing recovery are:: go to STR and transition to LTC if needed CMS Medicare.gov Compare Post Acute Care list provided to:: (sister [name]) Choice offered to / list presented to : Sibling  Expected Discharge Plan and Services Expected Discharge Plan: Skilled Nursing Facility   Discharge Planning Services: CM Consult Post Acute Care Choice: Skilled Nursing Facility Living arrangements for the past 2 months[place] [place] [place]                                      Prior Living Arrangements/Services Living arrangements for the past 2 months[place] [place] [place]   Patient language and need for interpreter reviewed:: Yes              Criminal Activity/Legal Involvement Pertinent to Current Situation/Hospitalization: No - Comment as needed  Activities of Daily Living Home Assistive Devices/Equipment: Environmental consultant (specify type) ADL Screening (condition at time of admission) Patient's  cognitive ability adequate to safely complete daily activities?: No Is the patient deaf or have difficulty hearing?: No Does the patient have difficulty seeing, even when wearing glasses/contacts?: No Does the patient have difficulty concentrating, remembering, or making decisions?: No Patient able to express need for assistance with ADLs?: Yes Does the patient have difficulty dressing or bathing?: Yes Independently performs ADLs?: Yes (appropriate for developmental age) Does the patient have difficulty walking or climbing stairs?: Yes Weakness of Legs: Both Weakness of Arms/Hands: Left  Permission Sought/Granted Permission sought to share information with : Facility Industrial/product designer granted to share information with : Yes, Verbal Permission Granted     Permission granted to share info w AGENCY: SNF        Emotional Assessment Appearance:: Appears stated age       Alcohol / Substance Use: Not Applicable    Admission diagnosis:  Pain [R52] Dislocation closed [T14.8XXA] Hypoxia [R09.02] Dislocation of left elbow, initial encounter [S53.105A] Congestive heart failure, unspecified HF chronicity, unspecified heart failure type [facility]) [I50.9] Patient Active Problem List   Diagnosis Date Noted  . Acute on chronic diastolic (congestive) heart failure (HCC) 11/18/2018  . TIA (transient ischemic attack) 10/27/2014   PCP:  [name], MD Pharmacy:  No Pharmacies Listed    Social Determinants of Health (SDOH) Interventions    Readmission Risk Interventions Readmission Risk Prevention Plan  11/22/2018  Transportation Screening Complete  PCP or Specialist Appt within 5-7 Days Complete  Home Care Screening Complete  Medication Review (RN CM) Complete  Some recent data might be hidden

## 2018-11-22 NOTE — [place] FL2 (Signed)
[place] MEDICAID FL2 LEVEL OF CARE SCREENING TOOL     IDENTIFICATION  Patient Name: James Mcbride Birthdate: [birth year] Sex: male Admission Date (Current Location): 11/18/2018  [place] and [place] Number:  [address] [id number] [facility] Facility and Address:  [facility], [address], [place], [place] [address]      Provider Number: [id number]  Attending Physician Name and Address:  Auburn Bilberry, MD  Relative Name and Phone Number:  Corbett,[name] Sister [phone]     Current Level of Care: Hospital Recommended Level of Care: Skilled Nursing Facility Prior Approval Number:    Date Approved/Denied:   PASRR Number: [id number]  Discharge Plan: SNF    Current Diagnoses: Patient Active Problem List   Diagnosis Date Noted  . Acute on chronic diastolic (congestive) heart failure (HCC) 11/18/2018  . TIA (transient ischemic attack) 10/27/2014    Orientation RESPIRATION BLADDER Height & Weight     Self, Time, Situation, Place  O2(3L) Incontinent Weight: (!) 166.5 kg Height:  6' (182.9 cm)  BEHAVIORAL SYMPTOMS/MOOD NEUROLOGICAL BOWEL NUTRITION STATUS      Incontinent Diet(heart healthy)  AMBULATORY STATUS COMMUNICATION OF NEEDS Skin   Limited Assist Verbally Normal                       Personal Care Assistance Level of Assistance  Bathing, Feeding, Dressing Bathing Assistance: Limited assistance Feeding assistance: Independent Dressing Assistance: Limited assistance     Functional Limitations Info  Sight, Hearing, Speech Sight Info: Adequate Hearing Info: Adequate Speech Info: Adequate    SPECIAL CARE FACTORS FREQUENCY  PT (By licensed PT)     PT Frequency: 5 x week              Contractures Contractures Info: Not present    Additional Factors Info  Code Status, Allergies Code Status Info: full Allergies Info: vasotec           Current Medications (11/22/2018):  This is the current hospital active medication  list Current Facility-Administered Medications  Medication Dose Route Frequency Provider Last Rate Last Dose  . 0.9 %  sodium chloride infusion  250 mL Intravenous PRN Mayo, Allyn Kenner, MD   Stopped at 11/22/18 1234  . acetaminophen (TYLENOL) tablet 650 mg  650 mg Oral Q4H PRN Campbell Stall, MD   650 mg at 11/20/18 2252  . aspirin chewable tablet 81 mg  81 mg Oral Daily Mayo, Allyn Kenner, MD   81 mg at 11/22/18 1043  . atorvastatin (LIPITOR) tablet 10 mg  10 mg Oral Daily Mayo, Allyn Kenner, MD   10 mg at 11/22/18 1042  . carvedilol (COREG) tablet 25 mg  25 mg Oral BID WC [place] Bilberry, MD      . enoxaparin (LOVENOX) injection 40 mg  40 mg Subcutaneous Q12H Albina Billet, RPH   40 mg at 11/22/18 1043  . fluticasone (FLONASE) 50 MCG/ACT nasal spray 1 spray  1 spray Each Nare BID Mayo, Allyn Kenner, MD   1 spray at 11/22/18 1040  . furosemide (LASIX) injection 60 mg  60 mg Intravenous Q12H [place] Bilberry, MD   60 mg at 11/22/18 1041  . hydrALAZINE (APRESOLINE) tablet 100 mg  100 mg Oral Q8H [place] Bilberry, MD   100 mg at 11/22/18 1341  . ipratropium-albuterol (DUONEB) 0.5-2.5 (3) MG/3ML nebulizer solution 3 mL  3 mL Nebulization TID [place] Bilberry, MD   3 mL at 11/22/18 1323  . isosorbide mononitrate (IMDUR) 24 hr tablet 30 mg  30 mg Oral Daily [place] BilberryPatel, Shreyang, MD   30 mg at 11/22/18 1041  . levETIRAcetam (KEPPRA) 750 mg in sodium chloride 0.9 % 100 mL IVPB  750 mg Intravenous Q12H Tukov-Yual, Magdalene S, NP   Stopped at 11/22/18 1210  . miconazole (MICOTIN) 2 % cream 1 application  1 application Topical BID Mayo, Allyn KennerKaty Dodd, MD   1 application at 11/22/18 1044  . ondansetron (ZOFRAN) injection 4 mg  4 mg Intravenous Q6H PRN Mayo, Allyn KennerKaty Dodd, MD      . PHENObarbital (LUMINAL) injection 65 mg  65 mg Intravenous QHS Tukov-Yual, Magdalene S, NP   65 mg at 11/21/18 2156  . sodium chloride (OCEAN) 0.65 % nasal spray 2 spray  2 spray Each Nare PRN Mayo, Allyn KennerKaty Dodd, MD      . sodium chloride flush (NS)  0.9 % injection 3 mL  3 mL Intravenous Q12H Mayo, Allyn KennerKaty Dodd, MD   3 mL at 11/22/18 1049  . sodium chloride flush (NS) 0.9 % injection 3 mL  3 mL Intravenous PRN Mayo, Allyn KennerKaty Dodd, MD         Discharge Medications: Please see discharge summary for a list of discharge medications.  Relevant Imaging Results:  Relevant Lab Results:   Additional Information SSN: [id number]  Sherren KernsJennifer L Amela Handley, RN

## 2018-11-22 NOTE — Progress Notes (Signed)
Patient Name: James Mcbride Date of Encounter: 11/22/2018  Hospital Problem List     Active Problems:   Acute on chronic diastolic (congestive) heart failure [facility])    Patient Profile     63 year old male with history of diastolic heart failure admitted with frequent falls and dislocation of left elbow.  Currently on Imdur 30 mg daily, off Lasix.  Renal function stable with normal GFR.  Potassium 4.7.  Tolerating BiPAP.  Shortness of breath improved  Subjective   Less short of breath  Inpatient Medications    . aspirin  81 mg Oral Daily  . atorvastatin  10 mg Oral Daily  . enoxaparin (LOVENOX) injection  40 mg Subcutaneous Q12H  . fluticasone  1 spray Each Nare BID  . ipratropium-albuterol  3 mL Nebulization TID  . isosorbide mononitrate  30 mg Oral Daily  . miconazole  1 application Topical BID  . PHENObarbital  65 mg Intravenous QHS  . sodium chloride flush  3 mL Intravenous Q12H    Vital Signs    Vitals:   11/21/18 2228 11/22/18 0306 11/22/18 0332 11/22/18 0728  BP:  (!) 165/63  (!) 169/79  Pulse: 68 (!) 59  (!) 57  Resp: 18 18    Temp:  98.9 F (37.2 C)  98.6 F (37 C)  TempSrc:  Oral  Oral  SpO2: 95% 100%  98%  Weight:   (!) 166.5 kg   Height:        Intake/Output Summary (Last 24 hours) at 11/22/2018 0738 Last data filed at 11/22/2018 0300 Gross per 24 hour  Intake 480 ml  Output 0 ml  Net 480 ml   Filed Weights   11/20/18 0312 11/21/18 0413 11/22/18 0332  Weight: (!) 172.3 kg (!) 167.2 kg (!) 166.5 kg    Physical Exam    GEN: Well nourished, well developed, in no acute distress.  HEENT: normal.  Neck: Supple, no JVD, carotid bruits, or masses. Cardiac: RRR, no murmurs, rubs, or gallops. No clubbing, cyanosis, edema.  Radials/DP/PT 2+ and equal bilaterally.  Respiratory:  Respirations regular and unlabored, clear to auscultation bilaterally. GI: Soft, nontender, nondistended, BS + x 4. MS: no deformity or atrophy. Skin: warm and dry, no  rash. Neuro:  Strength and sensation are intact. Psych: Normal affect.  Labs    CBC No results for input(s): WBC, NEUTROABS, HGB, HCT, MCV, PLT in the last 72 hours. Basic Metabolic Panel Recent Labs    16/24/46 0512 11/21/18 0455 11/22/18 0410  NA 142 142 141  K 4.8 4.5 4.7  CL 106 105 105  CO2 29 29 29   GLUCOSE 70 82 87  BUN 27* 27* 24*  CREATININE 1.08 0.99 1.08  CALCIUM 11.0* 10.7* 10.5*  MG 2.2 2.0  --   PHOS 3.8  --   --    Liver Function Tests No results for input(s): AST, ALT, ALKPHOS, BILITOT, PROT, ALBUMIN in the last 72 hours. No results for input(s): LIPASE, AMYLASE in the last 72 hours. Cardiac Enzymes No results for input(s): CKTOTAL, CKMB, CKMBINDEX, TROPONINI in the last 72 hours. BNP Recent Labs    11/19/18 1825  BNP 307.0*   D-Dimer No results for input(s): DDIMER in the last 72 hours. Hemoglobin A1C No results for input(s): HGBA1C in the last 72 hours. Fasting Lipid Panel No results for input(s): CHOL, HDL, LDLCALC, TRIG, CHOLHDL, LDLDIRECT in the last 72 hours. Thyroid Function Tests No results for input(s): TSH, T4TOTAL, T3FREE, THYROIDAB in the  last 72 hours.  Invalid input(s): FREET3  Telemetry    Sinus bradycardia  ECG      Radiology    Dg Pelvis 1-2 Views  Result Date: 11/18/2018 CLINICAL DATA:  Pelvic pain. EXAM: PELVIS - 1-2 VIEW COMPARISON:  None. FINDINGS: There is no evidence of pelvic fracture or diastasis. No pelvic bone lesions are seen. IMPRESSION: Negative. Electronically Signed   By: Lupita RaiderJames  Green Jr M.D.   On: 11/18/2018 09:36   Dg Elbow 2 Views Left  Result Date: 11/18/2018 CLINICAL DATA:  Status post closed reduction of elbow dislocation. EXAM: LEFT ELBOW - 1 VIEW COMPARISON:  11/18/2018 FINDINGS: A single lateral radiograph demonstrates interval reduction of the previously seen dislocations of the radius and ulna. Olecranon spurring is noted. IMPRESSION: Interval reduction of elbow dislocation. Electronically Signed    By: Sebastian AcheAllen  Grady M.D.   On: 11/18/2018 11:28   Dg Elbow Complete Left  Result Date: 11/18/2018 CLINICAL DATA:  Left elbow pain after fall five days ago. EXAM: LEFT ELBOW - COMPLETE 3+ VIEW COMPARISON:  Radiographs of Nov 13, 2018. FINDINGS: There is posterior dislocation of the proximal radius and ulna. Mild olecranon spurring is noted. Several well-corticated bone densities are again noted. No definite fracture is noted. IMPRESSION: Posterior dislocation of proximal radius and ulna relative to distal left humerus. No definite fracture is noted. Electronically Signed   By: Lupita RaiderJames  Green Jr M.D.   On: 11/18/2018 09:31   Dg Elbow Complete Left  Result Date: 11/13/2018 CLINICAL DATA:  Recent slip and fall with left elbow pain, initial encounter EXAM: LEFT ELBOW - COMPLETE 3+ VIEW COMPARISON:  None. FINDINGS: Olecranon spurring is noted. Small bony densities are noted adjacent to the articulation of the humerus with both the radius and ulna medially and laterally. No definitive donor sites are seen in these may be chronic in nature. Generalized soft tissue swelling is noted about elbow joint. Olecranon spur is noted. Elevation of the fat pads is noted consistent with acute joint effusion. IMPRESSION: Soft tissue changes as described. Bony densities adjacent to the elbow joint which appear well corticated and no donor sites are seen to suggest acute fracture. Nonemergent MRI may be helpful if symptomatology persists. Electronically Signed   By: Alcide CleverMark  Lukens M.D.   On: 11/13/2018 22:10   Dg Wrist Complete Left  Result Date: 11/18/2018 CLINICAL DATA:  Left wrist pain after fall 5 days ago EXAM: LEFT WRIST - COMPLETE 3+ VIEW COMPARISON:  None. FINDINGS: There is no evidence of fracture or dislocation. There is no evidence of arthropathy or other focal bone abnormality. Soft tissues are unremarkable. IMPRESSION: Negative. Electronically Signed   By: Lupita RaiderJames  Green Jr M.D.   On: 11/18/2018 09:33   Ct Angio Chest  Pe W Or Wo Contrast  Result Date: 11/19/2018 CLINICAL DATA:  Morbid obesity.  Abnormal chest radiograph EXAM: CT ANGIOGRAPHY CHEST WITH CONTRAST TECHNIQUE: Multidetector CT imaging of the chest was performed using the standard protocol during bolus administration of intravenous contrast. Multiplanar CT image reconstructions and MIPs were obtained to evaluate the vascular anatomy. CONTRAST:  75mL OMNIPAQUE IOHEXOL 350 MG/ML SOLN COMPARISON:  Radiograph 11/18/2018 FINDINGS: Cardiovascular:  Heart is enlarged.  No pericardial fluid. Exam is limited by respiratory motion and body habitus. No filling defects evident within the pulmonary arteries identified. The distal lower lobe pulmonary arteries are difficult to evaluate. Mediastinum/Nodes: No axillary supraclavicular adenopathy. No mediastinal and Lungs/Pleura: There is bibasilar linear peribronchial thickening and atelectasis within the medial LEFT and  RIGHT lower lobe. No air bronchograms. Upper lungs relatively clear. Upper Abdomen: No acute abnormality. Musculoskeletal: No chest wall abnormality. No acute or significant osseous findings. Review of the MIP images confirms the above findings. IMPRESSION: 1. Suboptimal exam due to patient respiratory motion and large body habitus. With this caveat, no evidence of acute pulmonary embolism. 2. Severe medial bibasilar atelectasis. Potential superimposed pneumonia or aspiration pneumonitis. Electronically Signed   By: Genevive Bi M.D.   On: 11/19/2018 13:49   US Venous Img Lower Unilateral Left  Result Date: 11/18/2018 CLINICAL DATA:  Left lower extremity pain and edema after suffering a fall this Sunday. Evaluate for DVT. EXAM: LEFT LOWER EXTREMITY VENOUS DOPPLER ULTRASOUND TECHNIQUE: Gray-scale sonography with graded compression, as well as color Doppler and duplex ultrasound were performed to evaluate the lower extremity deep venous systems from the level of the common femoral vein and including the common  femoral, femoral, profunda femoral, popliteal and calf veins including the posterior tibial, peroneal and gastrocnemius veins when visible. The superficial great saphenous vein was also interrogated. Spectral Doppler was utilized to evaluate flow at rest and with distal augmentation maneuvers in the common femoral, femoral and popliteal veins. COMPARISON:  None. FINDINGS: Examination is degraded due to patient body habitus. Contralateral Common Femoral Vein: Respiratory phasicity is normal and symmetric with the symptomatic side. No evidence of thrombus. Normal compressibility. Common Femoral Vein: No evidence of thrombus. Normal compressibility, respiratory phasicity and response to augmentation. Saphenofemoral Junction: No evidence of thrombus. Normal compressibility and flow on color Doppler imaging. Profunda Femoral Vein: No evidence of thrombus. Normal compressibility and flow on color Doppler imaging. Femoral Vein: No evidence of thrombus. Normal compressibility, respiratory phasicity and response to augmentation. Popliteal Vein: No evidence of thrombus. Normal compressibility, respiratory phasicity and response to augmentation. Calf Veins: Not well visualized. Superficial Great Saphenous Vein: No evidence of thrombus. Normal compressibility. Venous Reflux:  None. Other Findings: There is a minimal amount of subcutaneous edema at the level of the left calf (images 34 and 35). IMPRESSION: No evidence of DVT within the left lower extremity. Electronically Signed   By: Simonne Come M.D.   On: 11/18/2018 11:15   Dg Chest Portable 1 View  Result Date: 11/18/2018 CLINICAL DATA:  Decreased oxygen saturation.  Hypertension. EXAM: PORTABLE CHEST 1 VIEW COMPARISON:  October 12, 2017 FINDINGS: There is cardiomegaly with pulmonary venous hypertension. There is no appreciable interstitial edema. There is no airspace consolidation or pleural effusion appreciable. No adenopathy. No bone lesions. IMPRESSION: Persistent  pulmonary vascular congestion without overt edema or consolidation. No adenopathy evident. Electronically Signed   By: Bretta Bang III M.D.   On: 11/18/2018 10:25   Dg Femur Min 2 Views Left  Result Date: 11/18/2018 CLINICAL DATA:  Left leg pain after fall 5 days ago. EXAM: LEFT FEMUR 2 VIEWS COMPARISON:  None. FINDINGS: There is no evidence of fracture or other focal bone lesions. Soft tissues are unremarkable. IMPRESSION: Negative. Electronically Signed   By: Lupita Raider M.D.   On: 11/18/2018 09:34    Assessment & Plan    63 year old male with history HFpEF admitted after suffering several falls.  He has been diuresed although he is currently off any diuretics.  Tolerating BiPAP well.  We will continue with current regimen from a cardiac standpoint.  Hypercarbic respiratory failure-most likely hypoventilation due to body habitus and sleep apnea.  No PE.  Continue with BiPAP  Diastolic heart failure-continue to follow on current regimen.  Off of  Lasix drip.  Signed, Darlin Priestly Brendalyn Vallely MD 11/22/2018, 7:38 AM  Pager: [phone]

## 2018-11-22 NOTE — Progress Notes (Signed)
Pharmacy Electrolyte Monitoring Consult:  Pharmacy consulted to assist in monitoring and replacing electrolytes in this 63 y.o. male admitted on 11/18/2018 with Fall Patient with past medical history significant for Stroke, Seizure, Hypertension, and CHF.  Labs:  Sodium (mmol/L)  Date Value  11/22/2018 141  01/06/2014 138   Potassium (mmol/L)  Date Value  11/22/2018 4.7  01/06/2014 3.5   Magnesium (mg/dL)  Date Value  72/25/7505 2.0   Phosphorus (mg/dL)  Date Value  18/33/5825 3.8   Calcium (mg/dL)  Date Value  18/98/4210 10.5 (H)   Calcium, Total (mg/dL)  Date Value  31/28/1188 10.0   Albumin (g/dL)  Date Value  67/73/7366 3.6  01/06/2014 3.4    Assessment/Plan: Patient ordered furosemide 60mg  IV BID, then started on a lasix gtt. Now both orders d/c'ed.   No replacement warranted at this time.   Will replace for goal potassium 3.6-4 and goal magnesium 2.0-2.2.   Pharmacy will continue to monitor and adjust per consult.    Ronnald Ramp, PharmD, BCPS  11/22/2018 7:14 AM

## 2018-11-23 LAB — BASIC METABOLIC PANEL
Anion gap: 6 (ref 5–15)
BUN: 27 mg/dL — ABNORMAL HIGH (ref 8–23)
CO2: 30 mmol/L (ref 22–32)
Calcium: 10.8 mg/dL — ABNORMAL HIGH (ref 8.9–10.3)
Chloride: 103 mmol/L (ref 98–111)
Creatinine, Ser: 1.14 mg/dL (ref 0.61–1.24)
GFR calc Af Amer: >60 mL/min (ref >60)
GFR calc non Af Amer: >60 mL/min (ref >60)
Glucose, Bld: 94 mg/dL (ref 70–99)
Potassium: 4.8 mmol/L (ref 3.5–5.1)
Sodium: 139 mmol/L (ref 135–145)

## 2018-11-23 LAB — SARS CORONAVIRUS 2 BY RT PCR (HOSPITAL ORDER, PERFORMED IN ~~LOC~~ HOSPITAL LAB): SARS Coronavirus 2: NEGATIVE

## 2018-11-23 MED ORDER — FUROSEMIDE 40 MG PO TABS
40.0000 mg | ORAL_TABLET | Freq: Two times a day (BID) | ORAL | Status: DC
  Administered 2018-11-23 – 2018-11-24 (×3): 40 mg via ORAL
  Filled 2018-11-23 (×3): qty 1

## 2018-11-23 MED ORDER — HYDRALAZINE HCL 25 MG PO TABS: 100.0000 mg | ORAL_TABLET | Freq: Three times a day (TID) | ORAL | 0 refills | Status: AC

## 2018-11-23 MED ORDER — CHLORHEXIDINE GLUCONATE 0.12 % MT SOLN
15.0000 mL | Freq: Two times a day (BID) | OROMUCOSAL | Status: DC
  Administered 2018-11-23 – 2018-11-24 (×2): 15 mL via OROMUCOSAL
  Filled 2018-11-23 (×2): qty 15

## 2018-11-23 MED ORDER — FUROSEMIDE 40 MG PO TABS: 40.0000 mg | ORAL_TABLET | Freq: Two times a day (BID) | ORAL | Status: AC

## 2018-11-23 MED ORDER — ORAL CARE MOUTH RINSE
15.0000 mL | Freq: Two times a day (BID) | OROMUCOSAL | Status: DC
  Administered 2018-11-24: 15 mL via OROMUCOSAL

## 2018-11-23 MED ORDER — IPRATROPIUM-ALBUTEROL 0.5-2.5 (3) MG/3ML IN SOLN: 3.0000 mL | Freq: Three times a day (TID) | RESPIRATORY_TRACT | Status: AC

## 2018-11-23 NOTE — Discharge Summary (Addendum)
[facility] - [place] [facility],  y.o., DOB February 01, 1956, MRN [id number]. Admission date: 11/18/2018 Discharge Date 11/23/2018 Primary MD [name], MD Admitting Physician Campbell Stall, MD  Admission Diagnosis  Pain [R52] Dislocation closed [T14.8XXA] Hypoxia [R09.02] Dislocation of left elbow, initial encounter [S53.105A] Congestive heart failure, unspecified HF chronicity, unspecified heart failure type (HCC) [I50.9]  Discharge Diagnosis   Active Problems: Acute on chronic hypercarbic respiratory failure Acute on chronic diastolic CHF Severe morbid obesity Sleep apnea noncompliant with CPAP machine Left elbow dislocation Hypertension Previous history of CVA History of seizures   PT to use CPAP at bedtime setting of 12, with oxygen 4l all times     Hospital Course James Mcbride  is a 63 y.o. male with a known history of hypertension, seizures, stroke who presented to the ED with inability to walk and worsening lower extremity edema over the last week.  Patient states that his legs have been heavy and this has caused him to have difficulty walking.  He has had a couple falls over the last couple of days.  He endorses some mild shortness of breath.  He denies any chest pain.  He endorses mild orthopnea.  He does not use any oxygen at home.  He does not use a walker at baseline.  He endorses left elbow and left hip pain.   In the ED, vitals were unremarkable.  Labs were significant for BNP 205, hemoglobin 10.1.  Left elbow x-ray showed a posterior dislocation of the proximal radius and ulna.  Patient's elbow was reduced by the orthopedic surgeon on-call (Dr. Rosita Kea).  Left hip and pelvic x-rays were negative.  Left lower extremity duplex ultrasound was negative for DVT.  Chest x-ray showed pulmonary vascular congestion.  Patient was given a dose of IV Lasix and hospitalists were called for admission  Patient was admitted for acute diastolic  CHF.  He was treated with diuresis.  By the time I saw him yesterday was very significant lethargic ABG was done which showed that he had hypercarbic respiratory failure.  Patient had to be transferred to the ICU for BiPAP.  Patient was diuresed and was seen by cardiology.  Patient at home has been noncompliant with his CPAP machine.  He is also very morbidly obese and has not been very active.  Patient is very weak and deconditioned and needs to go to rehab.  He was seen by cardiology as well during hospitalization echo of the heart was done.  He also had a CT per PE which was negative for pulmonary embolism.   Due to patient's severe morbid obesity immobility he is at high risk of cardiopulmonary complications overall poor prognosis.  Patient also noticed to have shoulder dislocation which was reduced by Dr. Loreta Ave he will need outpatient follow-up with him this coming Friday.    Consults  pulmonary/intensive care, cardiology  Significant Tests:  See full reports for all details     Dg Pelvis 1-2 Views  Result Date: 11/18/2018 CLINICAL DATA:  Pelvic pain. EXAM: PELVIS - 1-2 VIEW COMPARISON:  None. FINDINGS: There is no evidence of pelvic fracture or diastasis. No pelvic bone lesions are seen. IMPRESSION: Negative. Electronically Signed   By: Lupita Raider M.D.   On: 11/18/2018 09:36   Dg Elbow 2 Views Left  Result Date: 11/18/2018 CLINICAL DATA:  Status post closed reduction of elbow dislocation. EXAM: LEFT ELBOW - 1 VIEW COMPARISON:  11/18/2018 FINDINGS: A single lateral radiograph  demonstrates interval reduction of the previously seen dislocations of the radius and ulna. Olecranon spurring is noted. IMPRESSION: Interval reduction of elbow dislocation. Electronically Signed   By: Sebastian Ache M.D.   On: 11/18/2018 11:28   Dg Elbow Complete Left  Result Date: 11/18/2018 CLINICAL DATA:  Left elbow pain after fall five days ago. EXAM: LEFT ELBOW - COMPLETE 3+ VIEW COMPARISON:  Radiographs  of Nov 13, 2018. FINDINGS: There is posterior dislocation of the proximal radius and ulna. Mild olecranon spurring is noted. Several well-corticated bone densities are again noted. No definite fracture is noted. IMPRESSION: Posterior dislocation of proximal radius and ulna relative to distal left humerus. No definite fracture is noted. Electronically Signed   By: Lupita Raider M.D.   On: 11/18/2018 09:31   Dg Elbow Complete Left  Result Date: 11/13/2018 CLINICAL DATA:  Recent slip and fall with left elbow pain, initial encounter EXAM: LEFT ELBOW - COMPLETE 3+ VIEW COMPARISON:  None. FINDINGS: Olecranon spurring is noted. Small bony densities are noted adjacent to the articulation of the humerus with both the radius and ulna medially and laterally. No definitive donor sites are seen in these may be chronic in nature. Generalized soft tissue swelling is noted about elbow joint. Olecranon spur is noted. Elevation of the fat pads is noted consistent with acute joint effusion. IMPRESSION: Soft tissue changes as described. Bony densities adjacent to the elbow joint which appear well corticated and no donor sites are seen to suggest acute fracture. Nonemergent MRI may be helpful if symptomatology persists. Electronically Signed   By: Alcide Clever M.D.   On: 11/13/2018 22:10   Dg Wrist Complete Left  Result Date: 11/18/2018 CLINICAL DATA:  Left wrist pain after fall 5 days ago EXAM: LEFT WRIST - COMPLETE 3+ VIEW COMPARISON:  None. FINDINGS: There is no evidence of fracture or dislocation. There is no evidence of arthropathy or other focal bone abnormality. Soft tissues are unremarkable. IMPRESSION: Negative. Electronically Signed   By: Lupita Raider M.D.   On: 11/18/2018 09:33   Ct Angio Chest Pe W Or Wo Contrast  Result Date: 11/19/2018 CLINICAL DATA:  Morbid obesity.  Abnormal chest radiograph EXAM: CT ANGIOGRAPHY CHEST WITH CONTRAST TECHNIQUE: Multidetector CT imaging of the chest was performed using the  standard protocol during bolus administration of intravenous contrast. Multiplanar CT image reconstructions and MIPs were obtained to evaluate the vascular anatomy. CONTRAST:  33mL OMNIPAQUE IOHEXOL 350 MG/ML SOLN COMPARISON:  Radiograph 11/18/2018 FINDINGS: Cardiovascular:  Heart is enlarged.  No pericardial fluid. Exam is limited by respiratory motion and body habitus. No filling defects evident within the pulmonary arteries identified. The distal lower lobe pulmonary arteries are difficult to evaluate. Mediastinum/Nodes: No axillary supraclavicular adenopathy. No mediastinal and Lungs/Pleura: There is bibasilar linear peribronchial thickening and atelectasis within the medial LEFT and RIGHT lower lobe. No air bronchograms. Upper lungs relatively clear. Upper Abdomen: No acute abnormality. Musculoskeletal: No chest wall abnormality. No acute or significant osseous findings. Review of the MIP images confirms the above findings. IMPRESSION: 1. Suboptimal exam due to patient respiratory motion and large body habitus. With this caveat, no evidence of acute pulmonary embolism. 2. Severe medial bibasilar atelectasis. Potential superimposed pneumonia or aspiration pneumonitis. Electronically Signed   By: Genevive Bi M.D.   On: 11/19/2018 13:49   US Venous Img Lower Unilateral Left  Result Date: 11/18/2018 CLINICAL DATA:  Left lower extremity pain and edema after suffering a fall this Sunday. Evaluate for DVT. EXAM: LEFT  LOWER EXTREMITY VENOUS DOPPLER ULTRASOUND TECHNIQUE: Gray-scale sonography with graded compression, as well as color Doppler and duplex ultrasound were performed to evaluate the lower extremity deep venous systems from the level of the common femoral vein and including the common femoral, femoral, profunda femoral, popliteal and calf veins including the posterior tibial, peroneal and gastrocnemius veins when visible. The superficial great saphenous vein was also interrogated. Spectral Doppler was  utilized to evaluate flow at rest and with distal augmentation maneuvers in the common femoral, femoral and popliteal veins. COMPARISON:  None. FINDINGS: Examination is degraded due to patient body habitus. Contralateral Common Femoral Vein: Respiratory phasicity is normal and symmetric with the symptomatic side. No evidence of thrombus. Normal compressibility. Common Femoral Vein: No evidence of thrombus. Normal compressibility, respiratory phasicity and response to augmentation. Saphenofemoral Junction: No evidence of thrombus. Normal compressibility and flow on color Doppler imaging. Profunda Femoral Vein: No evidence of thrombus. Normal compressibility and flow on color Doppler imaging. Femoral Vein: No evidence of thrombus. Normal compressibility, respiratory phasicity and response to augmentation. Popliteal Vein: No evidence of thrombus. Normal compressibility, respiratory phasicity and response to augmentation. Calf Veins: Not well visualized. Superficial Great Saphenous Vein: No evidence of thrombus. Normal compressibility. Venous Reflux:  None. Other Findings: There is a minimal amount of subcutaneous edema at the level of the left calf (images 34 and 35). IMPRESSION: No evidence of DVT within the left lower extremity. Electronically Signed   By: Simonne Come M.D.   On: 11/18/2018 11:15   Dg Chest Portable 1 View  Result Date: 11/18/2018 CLINICAL DATA:  Decreased oxygen saturation.  Hypertension. EXAM: PORTABLE CHEST 1 VIEW COMPARISON:  October 12, 2017 FINDINGS: There is cardiomegaly with pulmonary venous hypertension. There is no appreciable interstitial edema. There is no airspace consolidation or pleural effusion appreciable. No adenopathy. No bone lesions. IMPRESSION: Persistent pulmonary vascular congestion without overt edema or consolidation. No adenopathy evident. Electronically Signed   By: Bretta Bang III M.D.   On: 11/18/2018 10:25   Dg Femur Min 2 Views Left  Result Date:  11/18/2018 CLINICAL DATA:  Left leg pain after fall 5 days ago. EXAM: LEFT FEMUR 2 VIEWS COMPARISON:  None. FINDINGS: There is no evidence of fracture or other focal bone lesions. Soft tissues are unremarkable. IMPRESSION: Negative. Electronically Signed   By: Lupita Raider M.D.   On: 11/18/2018 09:34       Today   Subjective:   James Mcbride and doing much better is very weak and deconditioned Objective:   Blood pressure (!) 146/57, pulse 61, temperature 99.3 F (37.4 C), temperature source Oral, resp. rate 18, height 6' (1.829 m), weight (!) 163.1 kg, SpO2 96 %.  .  Intake/Output Summary (Last 24 hours) at 11/23/2018 1005 Last data filed at 11/23/2018 1001 Gross per 24 hour  Intake 480 ml  Output 1200 ml  Net -720 ml    Exam VITAL SIGNS: Blood pressure (!) 146/57, pulse 61, temperature 99.3 F (37.4 C), temperature source Oral, resp. rate 18, height 6' (1.829 m), weight (!) 163.1 kg, SpO2 96 %.  GENERAL:  63 y.o.-year-old patient lying in the bed morbidly obese EYES: Pupils equal, round, reactive to light and accommodation. No scleral icterus. Extraocular muscles intact.  HEENT: Head atraumatic, normocephalic. Oropharynx and nasopharynx clear.  NECK:  Supple, no jugular venous distention. No thyroid enlargement, no tenderness.  LUNGS: Diminished breath sounds.  CARDIOVASCULAR: S1, S2 normal. No murmurs, rubs, or gallops.  ABDOMEN: Soft, nontender, nondistended. Bowel sounds  present. No organomegaly or mass.  EXTREMITIES: No pedal edema, cyanosis, or clubbing.  NEUROLOGIC: Cranial nerves II through XII are intact. Muscle strength 5/5 in all extremities. Sensation intact. Gait not checked.  PSYCHIATRIC: The patient is alert and oriented x 3.  SKIN: No obvious rash, lesion, or ulcer.   Data Review     CBC w Diff:  Lab Results  Component Value Date   WBC 4.6 11/19/2018   HGB 10.2 (L) 11/19/2018   HGB 12.1 (L) 01/06/2014   HCT 34.6 (L) 11/19/2018   HCT 37.2 (L)  01/06/2014   PLT 114 (L) 11/19/2018   PLT 200 01/06/2014   LYMPHOPCT 34 11/18/2018   LYMPHOPCT 28.7 01/06/2014   MONOPCT 9 11/18/2018   MONOPCT 9.8 01/06/2014   EOSPCT 1 11/18/2018   EOSPCT 0.0 01/06/2014   BASOPCT 1 11/18/2018   BASOPCT 0.6 01/06/2014   CMP:  Lab Results  Component Value Date   NA 139 11/23/2018   NA 138 01/06/2014   K 4.8 11/23/2018   K 3.5 01/06/2014   CL 103 11/23/2018   CL 102 01/06/2014   CO2 30 11/23/2018   CO2 28 01/06/2014   BUN 27 (H) 11/23/2018   BUN 12 01/06/2014   CREATININE 1.14 11/23/2018   CREATININE 1.30 01/06/2014   PROT 6.8 11/18/2018   PROT 8.1 01/06/2014   ALBUMIN 3.6 11/18/2018   ALBUMIN 3.4 01/06/2014   BILITOT 0.4 11/18/2018   BILITOT 0.5 01/06/2014   ALKPHOS 112 11/18/2018   ALKPHOS 101 01/06/2014   AST 18 11/18/2018   AST 17 01/06/2014   ALT 16 11/18/2018   ALT 19 01/06/2014  .  Micro Results Recent Results (from the past 240 hour(s))  SARS Coronavirus 2 (CEPHEID - Performed in [facility] hospital lab), Hosp Order     Status: None   Collection Time: 11/18/18 12:00 PM  Result Value Ref Range Status   SARS Coronavirus 2 NEGATIVE NEGATIVE Final    Comment: (NOTE) If result is NEGATIVE SARS-CoV-2 target nucleic acids are NOT DETECTED. The SARS-CoV-2 RNA is generally detectable in upper and lower  respiratory specimens during the acute phase of infection. The lowest  concentration of SARS-CoV-2 viral copies this assay can detect is 250  copies / mL. A negative result does not preclude SARS-CoV-2 infection  and should not be used as the sole basis for treatment or other  patient management decisions.  A negative result may occur with  improper specimen collection / handling, submission of specimen other  than nasopharyngeal swab, presence of viral mutation(s) within the  areas targeted by this assay, and inadequate number of viral copies  (<250 copies / mL). A negative result must be combined with clinical   observations, patient history, and epidemiological information. If result is POSITIVE SARS-CoV-2 target nucleic acids are DETECTED. The SARS-CoV-2 RNA is generally detectable in upper and lower  respiratory specimens dur ing the acute phase of infection.  Positive  results are indicative of active infection with SARS-CoV-2.  Clinical  correlation with patient history and other diagnostic information is  necessary to determine patient infection status.  Positive results do  not rule out bacterial infection or co-infection with other viruses. If result is PRESUMPTIVE POSTIVE SARS-CoV-2 nucleic acids MAY BE PRESENT.   A presumptive positive result was obtained on the submitted specimen  and confirmed on repeat testing.  While 2019 novel coronavirus  (SARS-CoV-2) nucleic acids may be present in the submitted sample  additional confirmatory testing may be necessary  for epidemiological  and / or clinical management purposes  to differentiate between  SARS-CoV-2 and other Sarbecovirus currently known to infect humans.  If clinically indicated additional testing with an alternate test  methodology [phone]([id number]) is advised. The SARS-CoV-2 RNA is generally  detectable in upper and lower respiratory sp ecimens during the acute  phase of infection. The expected result is Negative. Fact Sheet for Patients:  BoilerBrush.com.cyhttps://www.fda.gov/media/136312/download Fact Sheet for Healthcare Providers: https://pope.com/https://www.fda.gov/media/136313/download This test is not yet approved or cleared by the [place] FDA and has been authorized for detection and/or diagnosis of SARS-CoV-2 by FDA under an Emergency Use Authorization (EUA).  This EUA will remain in effect (meaning this test can be used) for the duration of the COVID-19 declaration under Section 564(b)(1) of the Act, 21 U.S.C. section 360bbb-3(b)(1), unless the authorization is terminated or revoked sooner. Performed at [facility], [address] [place], [place] [address]   MRSA PCR Screening     Status: None   Collection Time: 11/19/18  1:50 PM  Result Value Ref Range Status   MRSA by PCR NEGATIVE NEGATIVE Final    Comment:        The GeneXpert MRSA Assay (FDA approved for NASAL specimens only), is one component of a comprehensive MRSA colonization surveillance program. It is not intended to diagnose MRSA infection nor to guide or monitor treatment for MRSA infections. Performed at [facility], [address] [place], [place] [address]         Code Status Orders  (From admission, onward)         Start     Ordered   11/18/18 1457  Full code  Continuous     11/18/18 1456        Code Status History    Date Active Date Inactive Code Status Order ID Comments User Context   10/27/2014 2202 10/28/2014 2200 Full Code [id number]  Enid BaasKalisetti, Radhika, MD Inpatient          Follow-up Information    Arlyss QueenSelvidge, William M, MD Follow up in 6 day(s).   Specialty:  Family Medicine Contact information: [address] [place] [address] [phone]        Kennedy BuckerMenz, Michael, MD Follow up on 11/25/2018.   Specialty:  Orthopedic Surgery Why:  shoulder dislocation Contact information: [address] [facility] [place] [address] [phone]        Dalia HeadingFath, Kenneth A, MD Follow up in 2 week(s).   Specialty:  Cardiology Why:  hosp f/u Contact information: [address] [place] [address] [phone]           Discharge Medications   Allergies as of 11/23/2018      Reactions   Vasotec [enalaprilat]    Swelling       Medication List    STOP taking these medications   amLODipine 10 MG tablet Commonly known as:  NORVASC   aspirin 81 MG tablet   labetalol 100 MG tablet Commonly known as:  NORMODYNE     TAKE these medications   acetaminophen 325 MG tablet Commonly known as:  TYLENOL Take 650 mg by mouth every 4 (four) hours as needed for mild pain,  fever or headache.   atorvastatin 10 MG tablet Commonly known as:  LIPITOR Take 10 mg by mouth daily.   carvedilol 25 MG tablet Commonly known as:  COREG Take 25 mg by mouth 2 (two) times daily with a meal.   doxazosin 8  MG tablet Commonly known as:  CARDURA Take 8 mg by mouth at bedtime.   fluticasone 50 MCG/ACT nasal spray Commonly known as:  FLONASE Place 1 spray into both nostrils 2 (two) times daily.   furosemide 40 MG tablet Commonly known as:  LASIX Take 1 tablet (40 mg total) by mouth 2 (two) times daily. What changed:  when to take this   hydrALAZINE 25 MG tablet Commonly known as:  APRESOLINE Take 4 tablets (100 mg total) by mouth 3 (three) times daily.   ipratropium-albuterol 0.5-2.5 (3) MG/3ML Soln Commonly known as:  DUONEB Take 3 mLs by nebulization 3 (three) times daily.   isosorbide mononitrate 30 MG 24 hr tablet Commonly known as:  IMDUR Take 30 mg by mouth daily.   levETIRAcetam 750 MG tablet Commonly known as:  KEPPRA Take 750 mg by mouth 2 (two) times daily.   miconazole 2 % cream Commonly known as:  MICOTIN Apply 1 application topically 3 (three) times daily. Apply lightly to lower abdomen   PHENobarbital 97.2 MG tablet Commonly known as:  LUMINAL Take 97.2 mg by mouth at bedtime.   senna 8.6 MG Tabs tablet Commonly known as:  SENOKOT Take 1 tablet by mouth at bedtime.   sodium chloride 0.65 % Soln nasal spray Commonly known as:  OCEAN Place 2 sprays into both nostrils as needed for congestion.          Total Time in preparing paper work, data evaluation and todays exam - 35 minutes  Auburn Bilberry M.D on 11/23/2018 at 10:05 AM [facility]   Office  [phone]

## 2018-11-23 NOTE — TOC Progression Note (Addendum)
Transition of Care [facility](TOC) - Progression Note    Patient Details  Name: James Mcbride MRN: [id number] Date of Birth: 1956/06/02  Transition of Care [facility](TOC) CM/SW Contact  Darleene Cleavernterhaus, Creola Krotz R, [place] Phone Number: 11/23/2018, 5:35 PM  Clinical Narrative:     CSW spoke with patient's sister Rene KocherRegina to discuss SNF placement options.  CSW told her that [facility] is the only offer that patient has for SNF placement.  CSW explained because he only has Medicaid it will be difficult to find a SNF that can accept patient.  CSW explained to patient's sister that the SNFs only have a limited amount of Medicaid beds, and Medicaid does not pay for rehab at SNF.  CSW explained to her that SNF would have to convert his Medicaid to Long term care Medicaid, and then once he is switched, then she may have better chance finding a SNF in [place] area.  CSW explained to patient's sister that she could potentially transfer once his Medicaid has been approved.  CSW asked if patient's sister has applied for Medicare, and she said she does not think he has.  CSW provided patient's sister with Medicare phone number to call them.  CSW informed her it may take awhile for him to be approved for Medicare, but to call Medicare to see if he is a candidate.  CSW explained to her that if he had Medicare he could go to a SNF under his rehab benefits and then transition.  Patient's sister is hesistant but somewhat agreeable to having patient [facility].  Patient's sister expressed that he has been there in the past, and he was not participating in therapy much, but once he was encouraged to he would work with therapy and able to walk again.  CSW called [facility] which is the facility she would like him to go to, they said since he does not have long term care Medicaid yet, they are not able to take patient unless it has been converted over.  Patient's sister would like CSW to call her in the morning.  CSW informed  patient's sister that he will need to go to SNF tomorrow since he will most likely be medically ready for discharge.  CSW was given permission for CSW to [facility] with [facility].  CSW to continue to follow patient's progress/.                                                                            Expected Discharge Plan: Skilled Nursing Facility Barriers to Discharge: Continued Medical Work up  Expected Discharge Plan and Services Expected Discharge Plan: Skilled Nursing Facility   Discharge Planning Services: CM Consult Post Acute Care Choice: Skilled Nursing Facility Living arrangements for the past 2 months: ([facility]) Expected Discharge Date: 11/23/18                                     Social Determinants of Health (SDOH) Interventions    Readmission Risk Interventions Readmission Risk Prevention Plan 11/22/2018  Transportation Screening Complete  PCP or Specialist Appt within  5-7 Days Complete  Home Care Screening Complete  Medication Review (RN CM) Complete  Some recent data might be hidden

## 2018-11-23 NOTE — Progress Notes (Signed)
This RN contacted patient sister, [name] and put patient on phone with her. This RN provided patient update at this time.

## 2018-11-23 NOTE — Progress Notes (Signed)
Pharmacy Electrolyte Monitoring Consult:  Pharmacy consulted to assist in monitoring and replacing electrolytes in this 63 y.o. male admitted on 11/18/2018 with Fall Patient with past medical history significant for Stroke, Seizure, Hypertension, and CHF.  Labs:  Sodium (mmol/L)  Date Value  11/23/2018 139  01/06/2014 138   Potassium (mmol/L)  Date Value  11/23/2018 4.8  01/06/2014 3.5   Magnesium (mg/dL)  Date Value  69/67/8938 2.0   Phosphorus (mg/dL)  Date Value  04/07/5101 3.8   Calcium (mg/dL)  Date Value  58/52/7782 10.8 (H)   Calcium, Total (mg/dL)  Date Value  42/35/3614 10.0   Albumin (g/dL)  Date Value  43/15/4008 3.6  01/06/2014 3.4    Assessment/Plan: Patient ordered furosemide 60mg  IV BID, then started on a lasix gtt. Now both orders d/c'ed.   No replacement warranted at this time.   Will replace for goal potassium 3.6-5 and goal magnesium 2.0-2.2.   Pharmacy will continue to monitor and adjust per consult.    Ronnald Ramp, PharmD, BCPS  11/23/2018 7:24 AM

## 2018-11-24 LAB — CBC
HCT: 31.4 % — ABNORMAL LOW (ref 39.0–52.0)
Hemoglobin: 9.6 g/dL — ABNORMAL LOW (ref 13.0–17.0)
MCH: 26.7 pg (ref 26.0–34.0)
MCHC: 30.6 g/dL (ref 30.0–36.0)
MCV: 87.2 fL (ref 80.0–100.0)
Platelets: 143 10*3/uL — ABNORMAL LOW (ref 150–400)
RBC: 3.6 MIL/uL — ABNORMAL LOW (ref 4.22–5.81)
RDW: 15.2 % (ref 11.5–15.5)
WBC: 8.6 10*3/uL (ref 4.0–10.5)
nRBC: 0 % (ref 0.0–0.2)

## 2018-11-24 MED ORDER — LEVETIRACETAM 100 MG/ML PO SOLN
750.0000 mg | Freq: Two times a day (BID) | ORAL | Status: DC
  Filled 2018-11-24: qty 7.5

## 2018-11-24 MED ORDER — PHENOBARBITAL 97.2 MG PO TABS: 97.2000 mg | ORAL_TABLET | Freq: Every day | ORAL | Status: DC

## 2018-11-24 NOTE — Plan of Care (Signed)
  Problem: Pain Managment: Goal: General experience of comfort will improve Outcome: Progressing   Problem: Skin Integrity: Goal: Risk for impaired skin integrity will decrease Outcome: Progressing   

## 2018-11-24 NOTE — Progress Notes (Signed)
Pharmacy Electrolyte Monitoring Consult:  Pharmacy consulted to assist in monitoring and replacing electrolytes in this 63 y.o. male admitted on 11/18/2018 with Fall Patient with past medical history significant for Stroke, Seizure, Hypertension, and CHF.  Labs:  Sodium (mmol/L)  Date Value  11/23/2018 139  01/06/2014 138   Potassium (mmol/L)  Date Value  11/23/2018 4.8  01/06/2014 3.5   Magnesium (mg/dL)  Date Value  12/75/1700 2.0   Phosphorus (mg/dL)  Date Value  17/49/4496 3.8   Calcium (mg/dL)  Date Value  75/91/6384 10.8 (H)   Calcium, Total (mg/dL)  Date Value  66/59/9357 10.0   Albumin (g/dL)  Date Value  01/77/9390 3.6  01/06/2014 3.4    Assessment/Plan: Patient started on lasix 40 mg BID   No replacement warranted at this time. No new labs.   Will replace for goal potassium 3.6-5 and goal magnesium 2.0-2.2.   Pharmacy will continue to monitor and adjust per consult.    Ronnald Ramp, PharmD, BCPS  11/24/2018 7:29 AM

## 2018-11-24 NOTE — Progress Notes (Signed)
[facility] [place] [facility]                                                                                                                                                                                  Patient Demographics   James Mcbride, is a 63 y.o. male, DOB - September 13, 1955, ZOX:[id number]  Admit date - 11/18/2018   Admitting Physician Campbell Stall, MD  Outpatient Primary MD for the patient is [name], MD   LOS - 6  Subjective: Pt doing better sob improved  CONSTITUTIONAL: No documented fever. No fatigue, weakness. No weight gain, no weight loss.  EYES: No blurry or double vision.  ENT: No tinnitus. No postnasal drip. No redness of the oropharynx.  RESPIRATORY: No cough, no wheeze, no hemoptysis.  Positive dyspnea.  CARDIOVASCULAR: No chest pain. No orthopnea. No palpitations. No syncope.  GASTROINTESTINAL: No nausea, no vomiting or diarrhea. No abdominal pain. No melena or hematochezia.  GENITOURINARY:  No urgency. No frequency. No dysuria. No hematuria. No obstructive symptoms. No discharge. No pain. No significant abnormal bleeding ENDOCRINE: No polyuria or nocturia. No heat or cold intolerance.  HEMATOLOGY: No anemia. No bruising. No bleeding. No purpura. No petechiae INTEGUMENTARY: No rashes. No lesions.  MUSCULOSKELETAL: No arthritis. No swelling. No gout.  NEUROLOGIC: No numbness, tingling, or ataxia. No seizure-type activity.  PSYCHIATRIC: No anxiety. No insomnia. No ADD.      Review of Systems:   CONSTITUTIONAL:     Vitals:   Vitals:   11/23/18 2346 11/24/18 0558 11/24/18 0739 11/24/18 0836  BP:  (!) 124/48  (!) 126/54  Pulse: 61 (!) 55  60  Resp: 19 19  20   Temp:  97.8 F (36.6 C)  98.5 F (36.9 C)  TempSrc:    Oral  SpO2:  96% 96% 90%  Weight:      Height:        Wt Readings from Last 3 Encounters:  11/23/18 (!) 163.1 kg  10/12/17 (!) 165.6 kg  07/18/16 (!) 159.7 kg     Intake/Output Summary (Last 24  hours) at 11/24/2018 1024 Last data filed at 11/24/2018 0000 Gross per 24 hour  Intake 482.26 ml  Output 0 ml  Net 482.26 ml    Physical Exam:   GENERAL: Morbidly obese male ill-appearing HEAD, EYES, EARS, NOSE AND THROAT: Atraumatic, normocephalic. Extraocular muscles are intact. Pupils equal and reactive to light. Sclerae anicteric. No conjunctival injection. No oro-pharyngeal erythema.  NECK: Supple. There is no jugular venous distention. No bruits, no lymphadenopathy, no thyromegaly.  HEART: Regular rate and rhythm,. No murmurs, no rubs, no clicks.  LUNGS: Limited breath sounds due to his  obese body habitus ABDOMEN: Soft, flat, nontender, nondistended. Has good bowel sounds. No hepatosplenomegaly appreciated.  EXTREMITIES: Positive edema  nEUROLOGIC: Patient very drowsy SKIN: Moist and warm with no rashes appreciated.  Psych: Not anxious, depressed LN: No inguinal LN enlargement    Antibiotics   Anti-infectives (From admission, onward)   None      Medications   Scheduled Meds: . aspirin  81 mg Oral Daily  . atorvastatin  10 mg Oral Daily  . carvedilol  25 mg Oral BID WC  . chlorhexidine  15 mL Mouth Rinse BID  . enoxaparin (LOVENOX) injection  40 mg Subcutaneous Q12H  . fluticasone  1 spray Each Nare BID  . furosemide  40 mg Oral BID  . hydrALAZINE  100 mg Oral Q8H  . ipratropium-albuterol  3 mL Nebulization TID  . isosorbide mononitrate  30 mg Oral Daily  . mouth rinse  15 mL Mouth Rinse q12n4p  . miconazole  1 application Topical BID  . PHENObarbital  65 mg Intravenous QHS  . sodium chloride flush  3 mL Intravenous Q12H   Continuous Infusions: . sodium chloride 250 mL (11/23/18 2209)  . levETIRAcetam 750 mg (11/24/18 0913)   PRN Meds:.sodium chloride, acetaminophen, ondansetron (ZOFRAN) IV, sodium chloride, sodium chloride flush   Data Review:   Micro Results Recent Results (from the past 240 hour(s))  SARS Coronavirus 2 (CEPHEID - Performed in [facility]  hospital lab), Hosp Order     Status: None   Collection Time: 11/18/18 12:00 PM  Result Value Ref Range Status   SARS Coronavirus 2 NEGATIVE NEGATIVE Final    Comment: (NOTE) If result is NEGATIVE SARS-CoV-2 target nucleic acids are NOT DETECTED. The SARS-CoV-2 RNA is generally detectable in upper and lower  respiratory specimens during the acute phase of infection. The lowest  concentration of SARS-CoV-2 viral copies this assay can detect is 250  copies / mL. A negative result does not preclude SARS-CoV-2 infection  and should not be used as the sole basis for treatment or other  patient management decisions.  A negative result may occur with  improper specimen collection / handling, submission of specimen other  than nasopharyngeal swab, presence of viral mutation(s) within the  areas targeted by this assay, and inadequate number of viral copies  (<250 copies / mL). A negative result must be combined with clinical  observations, patient history, and epidemiological information. If result is POSITIVE SARS-CoV-2 target nucleic acids are DETECTED. The SARS-CoV-2 RNA is generally detectable in upper and lower  respiratory specimens dur ing the acute phase of infection.  Positive  results are indicative of active infection with SARS-CoV-2.  Clinical  correlation with patient history and other diagnostic information is  necessary to determine patient infection status.  Positive results do  not rule out bacterial infection or co-infection with other viruses. If result is PRESUMPTIVE POSTIVE SARS-CoV-2 nucleic acids MAY BE PRESENT.   A presumptive positive result was obtained on the submitted specimen  and confirmed on repeat testing.  While 2019 novel coronavirus  (SARS-CoV-2) nucleic acids may be present in the submitted sample  additional confirmatory testing may be necessary for epidemiological  and / or clinical management purposes  to differentiate between  SARS-CoV-2 and other  Sarbecovirus currently known to infect humans.  If clinically indicated additional testing with an alternate test  methodology [id number]) is advised. The SARS-CoV-2 RNA is generally  detectable in upper and lower respiratory sp ecimens during the acute  phase of infection.  The expected result is Negative. Fact Sheet for Patients:  BoilerBrush.com.cy Fact Sheet for Healthcare Providers: https://pope.com/ This test is not yet approved or cleared by the [place] FDA and has been authorized for detection and/or diagnosis of SARS-CoV-2 by FDA under an Emergency Use Authorization (EUA).  This EUA will remain in effect (meaning this test can be used) for the duration of the COVID-19 declaration under Section 564(b)(1) of the Act, 21 U.S.C. section 360bbb-3(b)(1), unless the authorization is terminated or revoked sooner. Performed at [facility], [address] [place], [place] [address]   MRSA PCR Screening     Status: None   Collection Time: 11/19/18  1:50 PM  Result Value Ref Range Status   MRSA by PCR NEGATIVE NEGATIVE Final    Comment:        The GeneXpert MRSA Assay (FDA approved for NASAL specimens only), is one component of a comprehensive MRSA colonization surveillance program. It is not intended to diagnose MRSA infection nor to guide or monitor treatment for MRSA infections. Performed at [facility], [address] [place], [place] [address]   SARS Coronavirus 2 (CEPHEID - Performed in [facility] hospital lab), Hosp Order     Status: None   Collection Time: 11/23/18 11:17 AM  Result Value Ref Range Status   SARS Coronavirus 2 NEGATIVE NEGATIVE Final    Comment: (NOTE) If result is NEGATIVE SARS-CoV-2 target nucleic acids are NOT DETECTED. The SARS-CoV-2 RNA is generally detectable in upper and lower  respiratory specimens during the acute phase of infection. The lowest  concentration of  SARS-CoV-2 viral copies this assay can detect is 250  copies / mL. A negative result does not preclude SARS-CoV-2 infection  and should not be used as the sole basis for treatment or other  patient management decisions.  A negative result may occur with  improper specimen collection / handling, submission of specimen other  than nasopharyngeal swab, presence of viral mutation(s) within the  areas targeted by this assay, and inadequate number of viral copies  (<250 copies / mL). A negative result must be combined with clinical  observations, patient history, and epidemiological information. If result is POSITIVE SARS-CoV-2 target nucleic acids are DETECTED. The SARS-CoV-2 RNA is generally detectable in upper and lower  respiratory specimens dur ing the acute phase of infection.  Positive  results are indicative of active infection with SARS-CoV-2.  Clinical  correlation with patient history and other diagnostic information is  necessary to determine patient infection status.  Positive results do  not rule out bacterial infection or co-infection with other viruses. If result is PRESUMPTIVE POSTIVE SARS-CoV-2 nucleic acids MAY BE PRESENT.   A presumptive positive result was obtained on the submitted specimen  and confirmed on repeat testing.  While 2019 novel coronavirus  (SARS-CoV-2) nucleic acids may be present in the submitted sample  additional confirmatory testing may be necessary for epidemiological  and / or clinical management purposes  to differentiate between  SARS-CoV-2 and other Sarbecovirus currently known to infect humans.  If clinically indicated additional testing with an alternate test  methodology [id number]) is advised. The SARS-CoV-2 RNA is generally  detectable in upper and lower respiratory sp ecimens during the acute  phase of infection. The expected result is Negative. Fact Sheet for Patients:  BoilerBrush.com.cy Fact Sheet for Healthcare  Providers: https://pope.com/ This test is not yet approved or cleared by the [place] FDA and has been authorized for detection and/or diagnosis of SARS-CoV-2 by  FDA under an Emergency Use Authorization (EUA).  This EUA will remain in effect (meaning this test can be used) for the duration of the COVID-19 declaration under Section 564(b)(1) of the Act, 21 U.S.C. section 360bbb-3(b)(1), unless the authorization is terminated or revoked sooner. Performed at [facility], [address] [place], [place] [address]     Radiology Reports Dg Pelvis 1-2 Views  Result Date: 11/18/2018 CLINICAL DATA:  Pelvic pain. EXAM: PELVIS - 1-2 VIEW COMPARISON:  None. FINDINGS: There is no evidence of pelvic fracture or diastasis. No pelvic bone lesions are seen. IMPRESSION: Negative. Electronically Signed   By: Lupita RaiderJames  Green Jr M.D.   On: 11/18/2018 09:36   Dg Elbow 2 Views Left  Result Date: 11/18/2018 CLINICAL DATA:  Status post closed reduction of elbow dislocation. EXAM: LEFT ELBOW - 1 VIEW COMPARISON:  11/18/2018 FINDINGS: A single lateral radiograph demonstrates interval reduction of the previously seen dislocations of the radius and ulna. Olecranon spurring is noted. IMPRESSION: Interval reduction of elbow dislocation. Electronically Signed   By: Sebastian AcheAllen  Grady M.D.   On: 11/18/2018 11:28   Dg Elbow Complete Left  Result Date: 11/18/2018 CLINICAL DATA:  Left elbow pain after fall five days ago. EXAM: LEFT ELBOW - COMPLETE 3+ VIEW COMPARISON:  Radiographs of Nov 13, 2018. FINDINGS: There is posterior dislocation of the proximal radius and ulna. Mild olecranon spurring is noted. Several well-corticated bone densities are again noted. No definite fracture is noted. IMPRESSION: Posterior dislocation of proximal radius and ulna relative to distal left humerus. No definite fracture is noted. Electronically Signed   By: Lupita RaiderJames  Green Jr M.D.   On: 11/18/2018 09:31   Dg Elbow  Complete Left  Result Date: 11/13/2018 CLINICAL DATA:  Recent slip and fall with left elbow pain, initial encounter EXAM: LEFT ELBOW - COMPLETE 3+ VIEW COMPARISON:  None. FINDINGS: Olecranon spurring is noted. Small bony densities are noted adjacent to the articulation of the humerus with both the radius and ulna medially and laterally. No definitive donor sites are seen in these may be chronic in nature. Generalized soft tissue swelling is noted about elbow joint. Olecranon spur is noted. Elevation of the fat pads is noted consistent with acute joint effusion. IMPRESSION: Soft tissue changes as described. Bony densities adjacent to the elbow joint which appear well corticated and no donor sites are seen to suggest acute fracture. Nonemergent MRI may be helpful if symptomatology persists. Electronically Signed   By: Alcide CleverMark  Lukens M.D.   On: 11/13/2018 22:10   Dg Wrist Complete Left  Result Date: 11/18/2018 CLINICAL DATA:  Left wrist pain after fall 5 days ago EXAM: LEFT WRIST - COMPLETE 3+ VIEW COMPARISON:  None. FINDINGS: There is no evidence of fracture or dislocation. There is no evidence of arthropathy or other focal bone abnormality. Soft tissues are unremarkable. IMPRESSION: Negative. Electronically Signed   By: Lupita RaiderJames  Green Jr M.D.   On: 11/18/2018 09:33   Ct Angio Chest Pe W Or Wo Contrast  Result Date: 11/19/2018 CLINICAL DATA:  Morbid obesity.  Abnormal chest radiograph EXAM: CT ANGIOGRAPHY CHEST WITH CONTRAST TECHNIQUE: Multidetector CT imaging of the chest was performed using the standard protocol during bolus administration of intravenous contrast. Multiplanar CT image reconstructions and MIPs were obtained to evaluate the vascular anatomy. CONTRAST:  75mL OMNIPAQUE IOHEXOL 350 MG/ML SOLN COMPARISON:  Radiograph 11/18/2018 FINDINGS: Cardiovascular:  Heart is enlarged.  No pericardial fluid. Exam is limited by respiratory motion and body habitus. No filling defects evident  within the pulmonary  arteries identified. The distal lower lobe pulmonary arteries are difficult to evaluate. Mediastinum/Nodes: No axillary supraclavicular adenopathy. No mediastinal and Lungs/Pleura: There is bibasilar linear peribronchial thickening and atelectasis within the medial LEFT and RIGHT lower lobe. No air bronchograms. Upper lungs relatively clear. Upper Abdomen: No acute abnormality. Musculoskeletal: No chest wall abnormality. No acute or significant osseous findings. Review of the MIP images confirms the above findings. IMPRESSION: 1. Suboptimal exam due to patient respiratory motion and large body habitus. With this caveat, no evidence of acute pulmonary embolism. 2. Severe medial bibasilar atelectasis. Potential superimposed pneumonia or aspiration pneumonitis. Electronically Signed   By: Genevive Bi M.D.   On: 11/19/2018 13:49   US Venous Img Lower Unilateral Left  Result Date: 11/18/2018 CLINICAL DATA:  Left lower extremity pain and edema after suffering a fall this Sunday. Evaluate for DVT. EXAM: LEFT LOWER EXTREMITY VENOUS DOPPLER ULTRASOUND TECHNIQUE: Gray-scale sonography with graded compression, as well as color Doppler and duplex ultrasound were performed to evaluate the lower extremity deep venous systems from the level of the common femoral vein and including the common femoral, femoral, profunda femoral, popliteal and calf veins including the posterior tibial, peroneal and gastrocnemius veins when visible. The superficial great saphenous vein was also interrogated. Spectral Doppler was utilized to evaluate flow at rest and with distal augmentation maneuvers in the common femoral, femoral and popliteal veins. COMPARISON:  None. FINDINGS: Examination is degraded due to patient body habitus. Contralateral Common Femoral Vein: Respiratory phasicity is normal and symmetric with the symptomatic side. No evidence of thrombus. Normal compressibility. Common Femoral Vein: No evidence of thrombus. Normal  compressibility, respiratory phasicity and response to augmentation. Saphenofemoral Junction: No evidence of thrombus. Normal compressibility and flow on color Doppler imaging. Profunda Femoral Vein: No evidence of thrombus. Normal compressibility and flow on color Doppler imaging. Femoral Vein: No evidence of thrombus. Normal compressibility, respiratory phasicity and response to augmentation. Popliteal Vein: No evidence of thrombus. Normal compressibility, respiratory phasicity and response to augmentation. Calf Veins: Not well visualized. Superficial Great Saphenous Vein: No evidence of thrombus. Normal compressibility. Venous Reflux:  None. Other Findings: There is a minimal amount of subcutaneous edema at the level of the left calf (images 34 and 35). IMPRESSION: No evidence of DVT within the left lower extremity. Electronically Signed   By: Simonne Come M.D.   On: 11/18/2018 11:15   Dg Chest Portable 1 View  Result Date: 11/18/2018 CLINICAL DATA:  Decreased oxygen saturation.  Hypertension. EXAM: PORTABLE CHEST 1 VIEW COMPARISON:  October 12, 2017 FINDINGS: There is cardiomegaly with pulmonary venous hypertension. There is no appreciable interstitial edema. There is no airspace consolidation or pleural effusion appreciable. No adenopathy. No bone lesions. IMPRESSION: Persistent pulmonary vascular congestion without overt edema or consolidation. No adenopathy evident. Electronically Signed   By: Bretta Bang III M.D.   On: 11/18/2018 10:25   Dg Femur Min 2 Views Left  Result Date: 11/18/2018 CLINICAL DATA:  Left leg pain after fall 5 days ago. EXAM: LEFT FEMUR 2 VIEWS COMPARISON:  None. FINDINGS: There is no evidence of fracture or other focal bone lesions. Soft tissues are unremarkable. IMPRESSION: Negative. Electronically Signed   By: Lupita Raider M.D.   On: 11/18/2018 09:34     CBC Recent Labs  Lab 11/18/18 0837 11/19/18 0517 11/24/18 0626  WBC 5.4 4.6 8.6  HGB 10.1* 10.2* 9.6*  HCT  31.8* 34.6* 31.4*  PLT 130* 114* 143*  MCV 83.9 89.4 87.2  MCH 26.6 26.4 26.7  MCHC 31.8 29.5* 30.6  RDW 15.5 15.5 15.2  LYMPHSABS 1.8  --   --   MONOABS 0.5  --   --   EOSABS 0.1  --   --   BASOSABS 0.0  --   --     Chemistries  Recent Labs  Lab 11/18/18 0837 11/19/18 0517 11/20/18 0512 11/21/18 0455 11/22/18 0410 11/23/18 0321  NA 138 140 142 142 141 139  K 4.0 4.9 4.8 4.5 4.7 4.8  CL 105 106 106 105 105 103  CO2 GLUCOSE 127* 100* 70 82 87 94  BUN 18 24* 27* 27* 24* 27*  CREATININE 0.89 1.07 1.08 0.99 1.08 1.14  CALCIUM 10.6* 10.6* 11.0* 10.7* 10.5* 10.8*  MG  --   --  2.2 2.0  --   --   AST 18  --   --   --   --   --   ALT 16  --   --   --   --   --   ALKPHOS 112  --   --   --   --   --   BILITOT 0.4  --   --   --   --   --    ------------------------------------------------------------------------------------------------------------------ estimated creatinine clearance is 103.5 mL/min (by C-G formula based on SCr of 1.14 mg/dL). ------------------------------------------------------------------------------------------------------------------ No results for input(s): HGBA1C in the last 72 hours. ------------------------------------------------------------------------------------------------------------------ No results for input(s): CHOL, HDL, LDLCALC, TRIG, CHOLHDL, LDLDIRECT in the last 72 hours. ------------------------------------------------------------------------------------------------------------------ No results for input(s): TSH, T4TOTAL, T3FREE, THYROIDAB in the last 72 hours.  Invalid input(s): FREET3 ------------------------------------------------------------------------------------------------------------------ No results for input(s): VITAMINB12, FOLATE, FERRITIN, TIBC, IRON, RETICCTPCT in the last 72 hours.  Coagulation profile No results for input(s): INR, PROTIME in the last 168 hours.  No results for input(s): DDIMER in the  last 72 hours.  Cardiac Enzymes No results for input(s): CKMB, TROPONINI, MYOGLOBIN in the last 168 hours.  Invalid input(s): CK ------------------------------------------------------------------------------------------------------------------ Invalid input(s): POCBNP    Assessment & Plan  Patient is 63 year old with likely severe sleep apnea  Acute hypercarbic respiratory failure Continue BiPAP at nighttime Likely related to obesity hypoventilation syndrome and untreated sleep apnea CT chest shows no PE Likely atelectasis Patient not willing to participate too much with acitivy and daily care.    Acute on chronic diastolic congestive heart failure-  Echo shows normal EF likely has severe diastolic dysfunction Oral lasix  Left elbow dislocation- due to mechanical fall -Left elbow was reduced by Dr. Rosita Kea  -Needs to follow-up with Dr. Rosita Kea as an outpatient  Hypertension- we will resume some of his antihypertensive  History of stroke-  -Continue aspirin and Lipitor  History of seizures-no seizure-like activity recently -Continue home phenobarbital and Keppra  Normocytic anemia- no active bleeding  Hyperglycemia -Hemoglobin A1c 5.5 not a diabetic  OSA -Patient has been noncompliant with CPAP       Code Status Orders  (From admission, onward)         Start     Ordered   11/18/18 1457  Full code  Continuous     11/18/18 1456        Code Status History    Date Active Date Inactive Code Status Order ID Comments User Context   10/27/2014 2202 10/28/2014 2200 Full Code [id number]  Enid Baas, MD Inpatient       I discussed with patient's sister regarding overall poor prognosis  Consults pulmonary critical care   DVT Prophylaxis  Lovenox  Lab Results  Component Value Date   PLT 143 (L) 11/24/2018     Time Spent in minutes 35-minute greater than 50% of time spent in care coordination and counseling patient regarding the condition and  plan of care.   Auburn Bilberry M.D on 11/24/2018 at 10:24 AM  Between 7am to 6pm - Pager - [phone]  After 6pm go to [email or web address] - Social research officer, government  [employer]   Office  [phone]

## 2018-11-24 NOTE — [place] FL2 (Signed)
[place] MEDICAID FL2 LEVEL OF CARE SCREENING TOOL     IDENTIFICATION  Patient Name: James Mcbride Birthdate: Jan 04, 1956 Sex: male Admission Date (Current Location): 11/18/2018  [place] Number:  [address] [id number] [facility] Facility and Address:  [facility], [address] [place], [place] [address]      Provider Number: [id number]  Attending Physician Name and Address:  Auburn Bilberry, MD  Relative Name and Phone Number:  Corbett,[name] Sister [phone]     Current Level of Care: Hospital Recommended Level of Care: Skilled Nursing Facility Prior Approval Number:    Date Approved/Denied:   PASRR Number: [id number]  Discharge Plan: SNF    Current Diagnoses: Patient Active Problem List   Diagnosis Date Noted  . Acute on chronic diastolic (congestive) heart failure (HCC) 11/18/2018  . TIA (transient ischemic attack) 10/27/2014    Orientation RESPIRATION BLADDER Height & Weight     Self, Time, Situation, Place  O2(3L) Incontinent Weight: (!) 163.1 kg Height:  6' (182.9 cm)  BEHAVIORAL SYMPTOMS/MOOD NEUROLOGICAL BOWEL NUTRITION STATUS      Incontinent Diet(heart healthy)  AMBULATORY STATUS COMMUNICATION OF NEEDS Skin   Limited Assist Verbally Normal                       Personal Care Assistance Level of Assistance  Bathing, Feeding, Dressing Bathing Assistance: Limited assistance Feeding assistance: Independent Dressing Assistance: Limited assistance     Functional Limitations Info  Sight, Hearing, Speech Sight Info: Adequate Hearing Info: Adequate Speech Info: Adequate    SPECIAL CARE FACTORS FREQUENCY  PT (By licensed PT)     PT Frequency: 5 x week              Contractures Contractures Info: Not present    Additional Factors Info  Code Status, Allergies Code Status Info: full Allergies Info: vasotec           Current Medications (11/24/2018):  This is the current hospital active medication  list Current Facility-Administered Medications  Medication Dose Route Frequency Provider Last Rate Last Dose  . 0.9 %  sodium chloride infusion  250 mL Intravenous PRN Campbell Stall, MD 10 mL/hr at 11/23/18 2209 250 mL at 11/23/18 2209  . acetaminophen (TYLENOL) tablet 650 mg  650 mg Oral Q4H PRN Campbell Stall, MD   650 mg at 11/23/18 1019  . aspirin chewable tablet 81 mg  81 mg Oral Daily Mayo, Allyn Kenner, MD   81 mg at 11/24/18 0902  . atorvastatin (LIPITOR) tablet 10 mg  10 mg Oral Daily Mayo, Allyn Kenner, MD   10 mg at 11/24/18 0901  . carvedilol (COREG) tablet 25 mg  25 mg Oral BID WC Auburn Bilberry, MD   25 mg at 11/24/18 0901  . chlorhexidine (PERIDEX) 0.12 % solution 15 mL  15 mL Mouth Rinse BID Auburn Bilberry, MD   15 mL at 11/24/18 0901  . enoxaparin (LOVENOX) injection 40 mg  40 mg Subcutaneous Q12H Albina Billet, RPH   40 mg at 11/24/18 8891  . fluticasone (FLONASE) 50 MCG/ACT nasal spray 1 spray  1 spray Each Nare BID Mayo, Allyn Kenner, MD   1 spray at 11/24/18 [phone]  . furosemide (LASIX) tablet 40 mg  40 mg Oral BID Auburn Bilberry, MD   40 mg at 11/24/18 0901  . hydrALAZINE (APRESOLINE) tablet 100 mg  100 mg Oral Q8H Auburn Bilberry, MD   100 mg at 11/24/18 0609  . ipratropium-albuterol (DUONEB)  0.5-2.5 (3) MG/3ML nebulizer solution 3 mL  3 mL Nebulization TID Auburn BilberryPatel, Shreyang, MD   3 mL at 11/24/18 0739  . isosorbide mononitrate (IMDUR) 24 hr tablet 30 mg  30 mg Oral Daily Auburn BilberryPatel, Shreyang, MD   30 mg at 11/24/18 0902  . levETIRAcetam (KEPPRA) 750 mg in sodium chloride 0.9 % 100 mL IVPB  750 mg Intravenous Q12H Tukov-Yual, Magdalene S, NP 430 mL/hr at 11/24/18 0913 750 mg at 11/24/18 0913  . MEDLINE mouth rinse  15 mL Mouth Rinse q12n4p Auburn BilberryPatel, Shreyang, MD      . miconazole (MICOTIN) 2 % cream 1 application  1 application Topical BID Mayo, Allyn KennerKaty Dodd, MD   1 application at 11/24/18 [phone]  . ondansetron (ZOFRAN) injection 4 mg  4 mg Intravenous Q6H PRN Mayo, Allyn KennerKaty Dodd, MD      .  PHENObarbital (LUMINAL) injection 65 mg  65 mg Intravenous QHS Tukov-Yual, Magdalene S, NP   65 mg at 11/23/18 2203  . sodium chloride (OCEAN) 0.65 % nasal spray 2 spray  2 spray Each Nare PRN Mayo, Allyn KennerKaty Dodd, MD      . sodium chloride flush (NS) 0.9 % injection 3 mL  3 mL Intravenous Q12H Mayo, Allyn KennerKaty Dodd, MD   3 mL at 11/24/18 0903  . sodium chloride flush (NS) 0.9 % injection 3 mL  3 mL Intravenous PRN Mayo, Allyn KennerKaty Dodd, MD         Discharge Medications: Please see discharge summary for a list of discharge medications.  Relevant Imaging Results:  Relevant Lab Results:   Additional Information SSN: [id number]  Eber HongGreene, Roshanna Cimino R, RN

## 2018-11-24 NOTE — Discharge Summary (Signed)
[facility] - [name] at [facility], [name] y.o., DOB [birth year], MRN [id number]. Admission date: 11/18/2018 Discharge Date 11/24/2018 Primary MD [name], MD Admitting Physician Campbell Stall, MD  Admission Diagnosis  Pain [R52] Dislocation closed [T14.8XXA] Hypoxia [R09.02] Dislocation of left elbow, initial encounter [S53.105A] Congestive heart failure, unspecified HF chronicity, unspecified heart failure type (HCC) [I50.9]  Discharge Diagnosis   Active Problems: Acute on chronic hypercarbic respiratory failure Acute on chronic diastolic CHF Severe morbid obesity Sleep apnea noncompliant with CPAP machine Left elbow dislocation Hypertension Previous history of CVA History of seizures   PT to use CPAP at bedtime setting of 12, with oxygen 4l all times     Hospital Course James Mcbride  is a 63 y.o. male with a known history of hypertension, seizures, stroke who presented to the ED with inability to walk and worsening lower extremity edema over the last week.  Patient states that his legs have been heavy and this has caused him to have difficulty walking.  He has had a couple falls over the last couple of days.  He endorses some mild shortness of breath.  He denies any chest pain.  He endorses mild orthopnea.  He does not use any oxygen at home.  He does not use a walker at baseline.  He endorses left elbow and left hip pain.   In the ED, vitals were unremarkable.  Labs were significant for BNP 205, hemoglobin 10.1.  Left elbow x-ray showed a posterior dislocation of the proximal radius and ulna.  Patient's elbow was reduced by the orthopedic surgeon on-call (Dr. Rosita Kea).  Left hip and pelvic x-rays were negative.  Left lower extremity duplex ultrasound was negative for DVT.  Chest x-ray showed pulmonary vascular congestion.  Patient was given a dose of IV Lasix and hospitalists were called for admission  Patient was admitted for acute diastolic  CHF.  He was treated with diuresis.  By the time I saw him yesterday was very significant lethargic ABG was done which showed that he had hypercarbic respiratory failure.  Patient had to be transferred to the ICU for BiPAP.  Patient was diuresed and was seen by cardiology.  Patient at home has been noncompliant with his CPAP machine.  He is also very morbidly obese and has not been very active.  Patient is very weak and deconditioned and needs to go to rehab.  He was seen by cardiology as well during hospitalization echo of the heart was done.  He also had a CT per PE which was negative for pulmonary embolism.   Due to patient's severe morbid obesity immobility he is at high risk of cardiopulmonary complications overall poor prognosis.  Patient also noticed to have shoulder dislocation which was reduced by Dr. Loreta Ave he will need outpatient follow-up with him this coming Friday.    Consults  pulmonary/intensive care, cardiology  Significant Tests:  See full reports for all details     Dg Pelvis 1-2 Views  Result Date: 11/18/2018 CLINICAL DATA:  Pelvic pain. EXAM: PELVIS - 1-2 VIEW COMPARISON:  None. FINDINGS: There is no evidence of pelvic fracture or diastasis. No pelvic bone lesions are seen. IMPRESSION: Negative. Electronically Signed   By: Lupita Raider M.D.   On: 11/18/2018 09:36   Dg Elbow 2 Views Left  Result Date: 11/18/2018 CLINICAL DATA:  Status post closed reduction of elbow dislocation. EXAM: LEFT ELBOW - 1 VIEW COMPARISON:  11/18/2018 FINDINGS: A single lateral radiograph  demonstrates interval reduction of the previously seen dislocations of the radius and ulna. Olecranon spurring is noted. IMPRESSION: Interval reduction of elbow dislocation. Electronically Signed   By: Sebastian Ache M.D.   On: 11/18/2018 11:28   Dg Elbow Complete Left  Result Date: 11/18/2018 CLINICAL DATA:  Left elbow pain after fall five days ago. EXAM: LEFT ELBOW - COMPLETE 3+ VIEW COMPARISON:  Radiographs  of Nov 13, 2018. FINDINGS: There is posterior dislocation of the proximal radius and ulna. Mild olecranon spurring is noted. Several well-corticated bone densities are again noted. No definite fracture is noted. IMPRESSION: Posterior dislocation of proximal radius and ulna relative to distal left humerus. No definite fracture is noted. Electronically Signed   By: Lupita Raider M.D.   On: 11/18/2018 09:31   Dg Elbow Complete Left  Result Date: 11/13/2018 CLINICAL DATA:  Recent slip and fall with left elbow pain, initial encounter EXAM: LEFT ELBOW - COMPLETE 3+ VIEW COMPARISON:  None. FINDINGS: Olecranon spurring is noted. Small bony densities are noted adjacent to the articulation of the humerus with both the radius and ulna medially and laterally. No definitive donor sites are seen in these may be chronic in nature. Generalized soft tissue swelling is noted about elbow joint. Olecranon spur is noted. Elevation of the fat pads is noted consistent with acute joint effusion. IMPRESSION: Soft tissue changes as described. Bony densities adjacent to the elbow joint which appear well corticated and no donor sites are seen to suggest acute fracture. Nonemergent MRI may be helpful if symptomatology persists. Electronically Signed   By: Alcide Clever M.D.   On: 11/13/2018 22:10   Dg Wrist Complete Left  Result Date: 11/18/2018 CLINICAL DATA:  Left wrist pain after fall 5 days ago EXAM: LEFT WRIST - COMPLETE 3+ VIEW COMPARISON:  None. FINDINGS: There is no evidence of fracture or dislocation. There is no evidence of arthropathy or other focal bone abnormality. Soft tissues are unremarkable. IMPRESSION: Negative. Electronically Signed   By: Lupita Raider M.D.   On: 11/18/2018 09:33   Ct Angio Chest Pe W Or Wo Contrast  Result Date: 11/19/2018 CLINICAL DATA:  Morbid obesity.  Abnormal chest radiograph EXAM: CT ANGIOGRAPHY CHEST WITH CONTRAST TECHNIQUE: Multidetector CT imaging of the chest was performed using the  standard protocol during bolus administration of intravenous contrast. Multiplanar CT image reconstructions and MIPs were obtained to evaluate the vascular anatomy. CONTRAST:  33mL OMNIPAQUE IOHEXOL 350 MG/ML SOLN COMPARISON:  Radiograph 11/18/2018 FINDINGS: Cardiovascular:  Heart is enlarged.  No pericardial fluid. Exam is limited by respiratory motion and body habitus. No filling defects evident within the pulmonary arteries identified. The distal lower lobe pulmonary arteries are difficult to evaluate. Mediastinum/Nodes: No axillary supraclavicular adenopathy. No mediastinal and Lungs/Pleura: There is bibasilar linear peribronchial thickening and atelectasis within the medial LEFT and RIGHT lower lobe. No air bronchograms. Upper lungs relatively clear. Upper Abdomen: No acute abnormality. Musculoskeletal: No chest wall abnormality. No acute or significant osseous findings. Review of the MIP images confirms the above findings. IMPRESSION: 1. Suboptimal exam due to patient respiratory motion and large body habitus. With this caveat, no evidence of acute pulmonary embolism. 2. Severe medial bibasilar atelectasis. Potential superimposed pneumonia or aspiration pneumonitis. Electronically Signed   By: Genevive Bi M.D.   On: 11/19/2018 13:49   US Venous Img Lower Unilateral Left  Result Date: 11/18/2018 CLINICAL DATA:  Left lower extremity pain and edema after suffering a fall this Sunday. Evaluate for DVT. EXAM: LEFT  LOWER EXTREMITY VENOUS DOPPLER ULTRASOUND TECHNIQUE: Gray-scale sonography with graded compression, as well as color Doppler and duplex ultrasound were performed to evaluate the lower extremity deep venous systems from the level of the common femoral vein and including the common femoral, femoral, profunda femoral, popliteal and calf veins including the posterior tibial, peroneal and gastrocnemius veins when visible. The superficial great saphenous vein was also interrogated. Spectral Doppler was  utilized to evaluate flow at rest and with distal augmentation maneuvers in the common femoral, femoral and popliteal veins. COMPARISON:  None. FINDINGS: Examination is degraded due to patient body habitus. Contralateral Common Femoral Vein: Respiratory phasicity is normal and symmetric with the symptomatic side. No evidence of thrombus. Normal compressibility. Common Femoral Vein: No evidence of thrombus. Normal compressibility, respiratory phasicity and response to augmentation. Saphenofemoral Junction: No evidence of thrombus. Normal compressibility and flow on color Doppler imaging. Profunda Femoral Vein: No evidence of thrombus. Normal compressibility and flow on color Doppler imaging. Femoral Vein: No evidence of thrombus. Normal compressibility, respiratory phasicity and response to augmentation. Popliteal Vein: No evidence of thrombus. Normal compressibility, respiratory phasicity and response to augmentation. Calf Veins: Not well visualized. Superficial Great Saphenous Vein: No evidence of thrombus. Normal compressibility. Venous Reflux:  None. Other Findings: There is a minimal amount of subcutaneous edema at the level of the left calf (images 34 and 35). IMPRESSION: No evidence of DVT within the left lower extremity. Electronically Signed   By: Simonne ComeJohn  Watts M.D.   On: 11/18/2018 11:15   Dg Chest Portable 1 View  Result Date: 11/18/2018 CLINICAL DATA:  Decreased oxygen saturation.  Hypertension. EXAM: PORTABLE CHEST 1 VIEW COMPARISON:  October 12, 2017 FINDINGS: There is cardiomegaly with pulmonary venous hypertension. There is no appreciable interstitial edema. There is no airspace consolidation or pleural effusion appreciable. No adenopathy. No bone lesions. IMPRESSION: Persistent pulmonary vascular congestion without overt edema or consolidation. No adenopathy evident. Electronically Signed   By: Bretta BangWilliam  Woodruff III M.D.   On: 11/18/2018 10:25   Dg Femur Min 2 Views Left  Result Date:  11/18/2018 CLINICAL DATA:  Left leg pain after fall 5 days ago. EXAM: LEFT FEMUR 2 VIEWS COMPARISON:  None. FINDINGS: There is no evidence of fracture or other focal bone lesions. Soft tissues are unremarkable. IMPRESSION: Negative. Electronically Signed   By: Lupita RaiderJames  Green Jr M.D.   On: 11/18/2018 09:34       Today   Subjective:   Toy BakerHarold Rounds and doing much better is very weak and deconditioned Objective:   Blood pressure (!) 126/54, pulse 60, temperature 98.5 F (36.9 C), temperature source Oral, resp. rate 20, height 6' (1.829 m), weight (!) 163.1 kg, SpO2 90 %.  .  Intake/Output Summary (Last 24 hours) at 11/24/2018 1026 Last data filed at 11/24/2018 0900 Gross per 24 hour  Intake 722.26 ml  Output 0 ml  Net 722.26 ml    Exam VITAL SIGNS: Blood pressure (!) 126/54, pulse 60, temperature 98.5 F (36.9 C), temperature source Oral, resp. rate 20, height 6' (1.829 m), weight (!) 163.1 kg, SpO2 90 %.  GENERAL:  63 y.o.-year-old patient lying in the bed morbidly obese EYES: Pupils equal, round, reactive to light and accommodation. No scleral icterus. Extraocular muscles intact.  HEENT: Head atraumatic, normocephalic. Oropharynx and nasopharynx clear.  NECK:  Supple, no jugular venous distention. No thyroid enlargement, no tenderness.  LUNGS: Diminished breath sounds.  CARDIOVASCULAR: S1, S2 normal. No murmurs, rubs, or gallops.  ABDOMEN: Soft, nontender, nondistended. Bowel sounds  present. No organomegaly or mass.  EXTREMITIES: No pedal edema, cyanosis, or clubbing.  NEUROLOGIC: Cranial nerves II through XII are intact. Muscle strength 5/5 in all extremities. Sensation intact. Gait not checked.  PSYCHIATRIC: The patient is alert and oriented x 3.  SKIN: No obvious rash, lesion, or ulcer.   Data Review     CBC w Diff:  Lab Results  Component Value Date   WBC 8.6 11/24/2018   HGB 9.6 (L) 11/24/2018   HGB 12.1 (L) 01/06/2014   HCT 31.4 (L) 11/24/2018   HCT 37.2 (L)  01/06/2014   PLT 143 (L) 11/24/2018   PLT 200 01/06/2014   LYMPHOPCT 34 11/18/2018   LYMPHOPCT 28.7 01/06/2014   MONOPCT 9 11/18/2018   MONOPCT 9.8 01/06/2014   EOSPCT 1 11/18/2018   EOSPCT 0.0 01/06/2014   BASOPCT 1 11/18/2018   BASOPCT 0.6 01/06/2014   CMP:  Lab Results  Component Value Date   NA 139 11/23/2018   NA 138 01/06/2014   K 4.8 11/23/2018   K 3.5 01/06/2014   CL 103 11/23/2018   CL 102 01/06/2014   CO2 30 11/23/2018   CO2 28 01/06/2014   BUN 27 (H) 11/23/2018   BUN 12 01/06/2014   CREATININE 1.14 11/23/2018   CREATININE 1.30 01/06/2014   PROT 6.8 11/18/2018   PROT 8.1 01/06/2014   ALBUMIN 3.6 11/18/2018   ALBUMIN 3.4 01/06/2014   BILITOT 0.4 11/18/2018   BILITOT 0.5 01/06/2014   ALKPHOS 112 11/18/2018   ALKPHOS 101 01/06/2014   AST 18 11/18/2018   AST 17 01/06/2014   ALT 16 11/18/2018   ALT 19 01/06/2014  .  Micro Results Recent Results (from the past 240 hour(s))  SARS Coronavirus 2 (CEPHEID - Performed in [facility] hospital lab), Hosp Order     Status: None   Collection Time: 11/18/18 12:00 PM  Result Value Ref Range Status   SARS Coronavirus 2 NEGATIVE NEGATIVE Final    Comment: (NOTE) If result is NEGATIVE SARS-CoV-2 target nucleic acids are NOT DETECTED. The SARS-CoV-2 RNA is generally detectable in upper and lower  respiratory specimens during the acute phase of infection. The lowest  concentration of SARS-CoV-2 viral copies this assay can detect is 250  copies / mL. A negative result does not preclude SARS-CoV-2 infection  and should not be used as the sole basis for treatment or other  patient management decisions.  A negative result may occur with  improper specimen collection / handling, submission of specimen other  than nasopharyngeal swab, presence of viral mutation(s) within the  areas targeted by this assay, and inadequate number of viral copies  (<250 copies / mL). A negative result must be combined with clinical   observations, patient history, and epidemiological information. If result is POSITIVE SARS-CoV-2 target nucleic acids are DETECTED. The SARS-CoV-2 RNA is generally detectable in upper and lower  respiratory specimens dur ing the acute phase of infection.  Positive  results are indicative of active infection with SARS-CoV-2.  Clinical  correlation with patient history and other diagnostic information is  necessary to determine patient infection status.  Positive results do  not rule out bacterial infection or co-infection with other viruses. If result is PRESUMPTIVE POSTIVE SARS-CoV-2 nucleic acids MAY BE PRESENT.   A presumptive positive result was obtained on the submitted specimen  and confirmed on repeat testing.  While 2019 novel coronavirus  (SARS-CoV-2) nucleic acids may be present in the submitted sample  additional confirmatory testing may be necessary  for epidemiological  and / or clinical management purposes  to differentiate between  SARS-CoV-2 and other Sarbecovirus currently known to infect humans.  If clinically indicated additional testing with an alternate test  methodology [phone]) is advised. The SARS-CoV-2 RNA is generally  detectable in upper and lower respiratory sp ecimens during the acute  phase of infection. The expected result is Negative. Fact Sheet for Patients:  BoilerBrush.com.cy Fact Sheet for Healthcare Providers: https://pope.com/ This test is not yet approved or cleared by the [place] FDA and has been authorized for detection and/or diagnosis of SARS-CoV-2 by FDA under an Emergency Use Authorization (EUA).  This EUA will remain in effect (meaning this test can be used) for the duration of the COVID-19 declaration under Section 564(b)(1) of the Act, 21 U.S.C. section 360bbb-3(b)(1), unless the authorization is terminated or revoked sooner. Performed at [facility], [address] [place], [place] [address]   MRSA PCR Screening     Status: None   Collection Time: 11/19/18  1:50 PM  Result Value Ref Range Status   MRSA by PCR NEGATIVE NEGATIVE Final    Comment:        The GeneXpert MRSA Assay (FDA approved for NASAL specimens only), is one component of a comprehensive MRSA colonization surveillance program. It is not intended to diagnose MRSA infection nor to guide or monitor treatment for MRSA infections. Performed at [facility], [address] [place], [place] [address]   SARS Coronavirus 2 (CEPHEID - Performed in [facility] hospital lab), Hosp Order     Status: None   Collection Time: 11/23/18 11:17 AM  Result Value Ref Range Status   SARS Coronavirus 2 NEGATIVE NEGATIVE Final    Comment: (NOTE) If result is NEGATIVE SARS-CoV-2 target nucleic acids are NOT DETECTED. The SARS-CoV-2 RNA is generally detectable in upper and lower  respiratory specimens during the acute phase of infection. The lowest  concentration of SARS-CoV-2 viral copies this assay can detect is 250  copies / mL. A negative result does not preclude SARS-CoV-2 infection  and should not be used as the sole basis for treatment or other  patient management decisions.  A negative result may occur with  improper specimen collection / handling, submission of specimen other  than nasopharyngeal swab, presence of viral mutation(s) within the  areas targeted by this assay, and inadequate number of viral copies  (<250 copies / mL). A negative result must be combined with clinical  observations, patient history, and epidemiological information. If result is POSITIVE SARS-CoV-2 target nucleic acids are DETECTED. The SARS-CoV-2 RNA is generally detectable in upper and lower  respiratory specimens dur ing the acute phase of infection.  Positive  results are indicative of active infection with SARS-CoV-2.  Clinical  correlation with patient history and other diagnostic information is   necessary to determine patient infection status.  Positive results do  not rule out bacterial infection or co-infection with other viruses. If result is PRESUMPTIVE POSTIVE SARS-CoV-2 nucleic acids MAY BE PRESENT.   A presumptive positive result was obtained on the submitted specimen  and confirmed on repeat testing.  While 2019 novel coronavirus  (SARS-CoV-2) nucleic acids may be present in the submitted sample  additional confirmatory testing may be necessary for epidemiological  and / or clinical management purposes  to differentiate between  SARS-CoV-2 and other Sarbecovirus currently known to infect humans.  If clinically indicated additional testing with an alternate test  methodology [phone]) is advised. The SARS-CoV-2 RNA  is generally  detectable in upper and lower respiratory sp ecimens during the acute  phase of infection. The expected result is Negative. Fact Sheet for Patients:  BoilerBrush.com.cy Fact Sheet for Healthcare Providers: https://pope.com/ This test is not yet approved or cleared by the [place] FDA and has been authorized for detection and/or diagnosis of SARS-CoV-2 by FDA under an Emergency Use Authorization (EUA).  This EUA will remain in effect (meaning this test can be used) for the duration of the COVID-19 declaration under Section 564(b)(1) of the Act, 21 U.S.C. section 360bbb-3(b)(1), unless the authorization is terminated or revoked sooner. Performed at [facility], [address] [place], [place] [address]         Code Status Orders  (From admission, onward)         Start     Ordered   11/18/18 1457  Full code  Continuous     11/18/18 1456        Code Status History    Date Active Date Inactive Code Status Order ID Comments User Context   10/27/2014 2202 10/28/2014 2200 Full Code [id number]  Enid Baas, MD Inpatient          Follow-up Information    [name], MD In 6 days.   Specialty:  Family Medicine Contact information: [address] [place] [address] [phone]        Kennedy Bucker, MD On 11/25/2018.   Specialty:  Orthopedic Surgery Why:  shoulder dislocation Contact information: [address] [facility] [place] [address] [phone]        Dalia Heading, MD In 2 weeks.   Specialty:  Cardiology Why:  hosp f/u....Marland KitchenMarland Kitchenpatient needs to see his pcp for referral Contact information: [address] [place] [address] [phone]           Discharge Medications   Allergies as of 11/24/2018      Reactions   Vasotec [enalaprilat]    Swelling       Medication List    STOP taking these medications   amLODipine 10 MG tablet Commonly known as:  NORVASC   aspirin 81 MG tablet   labetalol 100 MG tablet Commonly known as:  NORMODYNE     TAKE these medications   acetaminophen 325 MG tablet Commonly known as:  TYLENOL Take 650 mg by mouth every 4 (four) hours as needed for mild pain, fever or headache.   atorvastatin 10 MG tablet Commonly known as:  LIPITOR Take 10 mg by mouth daily.   carvedilol 25 MG tablet Commonly known as:  COREG Take 25 mg by mouth 2 (two) times daily with a meal.   doxazosin 8 MG tablet Commonly known as:  CARDURA Take 8 mg by mouth at bedtime.   fluticasone 50 MCG/ACT nasal spray Commonly known as:  FLONASE Place 1 spray into both nostrils 2 (two) times daily.   furosemide 40 MG tablet Commonly known as:  LASIX Take 1 tablet (40 mg total) by mouth 2 (two) times daily. What changed:  when to take this   hydrALAZINE 25 MG tablet Commonly known as:  APRESOLINE Take 4 tablets (100 mg total) by mouth 3 (three) times daily.   ipratropium-albuterol 0.5-2.5 (3) MG/3ML Soln Commonly known as:  DUONEB Take 3 mLs by nebulization 3 (three) times daily.   isosorbide mononitrate 30 MG 24 hr tablet Commonly known as:  IMDUR Take 30 mg  by mouth daily.   levETIRAcetam  750 MG tablet Commonly known as:  KEPPRA Take 750 mg by mouth 2 (two) times daily.   miconazole 2 % cream Commonly known as:  MICOTIN Apply 1 application topically 3 (three) times daily. Apply lightly to lower abdomen   PHENobarbital 97.2 MG tablet Commonly known as:  LUMINAL Take 97.2 mg by mouth at bedtime.   senna 8.6 MG Tabs tablet Commonly known as:  SENOKOT Take 1 tablet by mouth at bedtime.   sodium chloride 0.65 % Soln nasal spray Commonly known as:  OCEAN Place 2 sprays into both nostrils as needed for congestion.          Total Time in preparing paper work, data evaluation and todays exam - 35 minutes  Auburn Bilberry M.D on 11/24/2018 at 10:26 AM [facility]   Office  [phone]

## 2018-11-24 NOTE — TOC Transition Note (Signed)
Transition of Care [facility]) - CM/SW Discharge Note   Patient Details  Name: DEONTREZ SLAVENS MRN: [id number] Date of Birth: [birth year]  [facility]) CM/SW Contact:  Eber Hong, RN Phone Number: 11/24/2018, 2:16 PM   Clinical Narrative:    Patient required a SNF pasarr.  He currently has pasrr for assisted living level of care. Referred for manual review.  Uploaded  H/P, DC Summary and FL2 to [place] [facility]. Informed primary nurse ready to call ems and call report. Informed must make sure patient has spoken with his sister per her request this morning.  Final next level of care: Skilled Nursing Facility Barriers to Discharge: No Barriers Identified   Patient Goals and CMS Choice Patient states their goals for this hospitalization and ongoing recovery are:: go to STR and transition to LTC if needed CMS Medicare.gov Compare Post Acute Care list provided to:: (sister [name]) Choice offered to / list presented to : Sibling(sister aware [facility] the only facility to offer a bed)  Discharge Placement PASRR number recieved: 11/23/18            Patient chooses bed at: [facility] only facility to offer a bed) Patient to be transferred to facility by: [facility]   Patient and family notified of of transfer: 11/24/18  Discharge Plan and Services   Discharge Planning Services: CM Consult Post Acute Care Choice: Skilled Nursing Facility                               Social Determinants of Health (SDOH) Interventions     Readmission Risk Interventions Readmission Risk Prevention Plan 11/22/2018  Transportation Screening Complete  PCP or Specialist Appt within 5-7 Days Complete  Home Care Screening Complete  Medication Review (RN CM) Complete  Some recent data might be hidden

## 2018-11-24 NOTE — TOC Transition Note (Signed)
Transition of Care [facility]) - CM/SW Discharge Note   Patient Details  Name: James Mcbride MRN: [id number] Date of Birth: August 26, 1955  [facility] of Care [facility] CM/SW Contact:  Eber Hong, RN Phone Number: 11/24/2018, 10:10 AM   Clinical Narrative:   Patient for discharge to [facility] today. To travel by EMS.  Spoke with [name] admission coordinator. Sent Covid results, signed FL2 and discharge summary through the hub. Information regarding oxygen and cpap in the discharge summary.Updated patient and his sister.   Final next level of care: Skilled Nursing Facility Barriers to Discharge: No Barriers Identified   Patient Goals and CMS Choice Patient states their goals for this hospitalization and ongoing recovery are:: go to [facility] and transition to LTC if needed CMS Medicare.gov Compare Post Acute Care list provided to:: (sister [name]) Choice offered to / list presented to : Sibling(sister aware [facility] the only facility to offer a bed)  Discharge Placement PASRR number recieved: 11/23/18            Patient chooses bed at: [facility] only facility to offer a bed) Patient to be transferred to facility by: [facility]   Patient and family notified of of transfer: 11/24/18  Discharge Plan and Services   Discharge Planning Services: CM Consult Post Acute Care Choice: Skilled Nursing Facility                               Social Determinants of Health (SDOH) Interventions     Readmission Risk Interventions Readmission Risk Prevention Plan 11/22/2018  Transportation Screening Complete  PCP or Specialist Appt within 5-7 Days Complete  Home Care Screening Complete  Medication Review (RN CM) Complete  Some recent data might be hidden

## 2018-12-21 DEATH — deceased
